# Patient Record
Sex: Female | Born: 1960 | Race: Black or African American | Hispanic: No | Smoking: Current every day smoker
Health system: Southern US, Community
[De-identification: ages and names within clinical notes are randomized; demographics above are authoritative.]

## PROBLEM LIST (undated history)

## (undated) ENCOUNTER — Emergency Department (HOSPITAL_COMMUNITY): Discharge status: Absent without leave | Payer: BLUE CROSS/BLUE SHIELD

## (undated) DIAGNOSIS — I10 Essential (primary) hypertension: Secondary | ICD-10-CM

## (undated) DIAGNOSIS — E119 Type 2 diabetes mellitus without complications: Secondary | ICD-10-CM

---

## 2013-03-22 ENCOUNTER — Emergency Department: Payer: Self-pay | Admitting: Emergency Medicine

## 2013-03-25 LAB — BETA STREP CULTURE(ARMC)

## 2014-09-10 ENCOUNTER — Emergency Department
Admission: EM | Admit: 2014-09-10 | Discharge: 2014-09-10 | Disposition: A | Discharge status: Home / Self Care | Payer: Self-pay | Attending: Emergency Medicine | Admitting: Emergency Medicine

## 2014-09-10 DIAGNOSIS — L729 Follicular cyst of the skin and subcutaneous tissue, unspecified: Secondary | ICD-10-CM | POA: Insufficient documentation

## 2014-09-10 DIAGNOSIS — L089 Local infection of the skin and subcutaneous tissue, unspecified: Secondary | ICD-10-CM

## 2014-09-10 DIAGNOSIS — E119 Type 2 diabetes mellitus without complications: Secondary | ICD-10-CM | POA: Insufficient documentation

## 2014-09-10 MED ORDER — SULFAMETHOXAZOLE-TRIMETHOPRIM 800-160 MG PO TABS
1.0000 | ORAL_TABLET | Freq: Once | ORAL | Status: AC
  Administered 2014-09-10: 1 via ORAL
  Filled 2014-09-10: qty 1

## 2014-09-10 MED ORDER — LIDOCAINE-EPINEPHRINE (PF) 1 %-1:200000 IJ SOLN
INTRAMUSCULAR | Status: AC
  Filled 2014-09-10: qty 30

## 2014-09-10 MED ORDER — SULFAMETHOXAZOLE-TRIMETHOPRIM 800-160 MG PO TABS: 1.0000 | ORAL_TABLET | Freq: Two times a day (BID) | ORAL | Status: DC

## 2014-09-10 MED ORDER — LIDOCAINE-EPINEPHRINE 1 %-1:100000 IJ SOLN: 30.0000 mL | Freq: Once | INTRAMUSCULAR | Status: DC

## 2014-09-10 NOTE — ED Notes (Signed)
Patient to ED with c/o abscess to left upper chest, patient reports initially started as a insect bite to that area last Saturday but has not gotten better.

## 2014-09-10 NOTE — Discharge Instructions (Signed)
Type 2 Diabetes Mellitus Type 2 diabetes mellitus is a long-term (chronic) disease. In type 2 diabetes:  The pancreas does not make enough of a hormone called insulin.  The cells in the body do not respond as well to the insulin that is made.  Both of the above can happen. Normally, insulin moves sugars from food into tissue cells. This gives you energy. If you have type 2 diabetes, sugars cannot be moved into tissue cells. This causes high blood sugar (hyperglycemia).  HOME CARE  Have your hemoglobin A1c level checked twice a year. The level shows if your diabetes is under control or out of control.  Test your blood sugar level every day as told by your doctor.  Check your ketone levels by testing your pee (urine) when you are sick and as told.  Take your diabetes or insulin medicine as told by your doctor.  Never run out of insulin.  Adjust how much insulin you give yourself based on how many carbs (carbohydrates) you eat. Carbs are in many foods, such as fruits, vegetables, whole grains, and dairy products.  Have a healthy snack between every healthy meal. Have 3 meals and 3 snacks a day.  Lose weight if you are overweight.  Carry a medical alert card or wear your medical alert jewelry.  Carry a 15-gram carb snack with you at all times. Examples include:  Glucose pills, 3 or 4.  Glucose gel, 15-gram tube.  Raisins, 2 tablespoons (24 grams).  Jelly beans, 6.  Animal crackers, 8.  Regular (not diet) pop, 4 ounces (120 milliliters).  Gummy treats, 9.  Notice low blood sugar (hypoglycemia) symptoms, such as:  Shaking (tremors).  Trouble thinking clearly.  Sweating.  Faster heart rate.  Headache.  Dry mouth.  Hunger.  Crabbiness (irritability).  Being worried or tense (anxious).  Restless sleep.  A change in speech or coordination.  Confusion.  Treat low blood sugar right away. If you are alert and can swallow, follow the 15:15 rule:  Take 15-20  grams of a rapid-acting glucose or carb. This includes glucose gel, glucose pills, or 4 ounces (120 milliliters) of fruit juice, regular pop, or low-fat milk.  Check your blood sugar level 15 minutes after taking the glucose.  Take 15-20 grams more of glucose if the repeat blood sugar level is still 70 mg/dL (milligrams/deciliter) or below.  Eat a meal or snack within 1 hour of the blood sugar levels going back to normal.  Notice early symptoms of high blood sugar, such as:  Being really thirsty or drinking a lot (polydipsia).  Peeing a lot (polyuria).  Do at least 150 minutes of physical activity a week or as told.  Split the 150 minutes of activity up during the week. Do not do 150 minutes of activity in one day.  Perform exercises, such as weight lifting, at least 2 times a week or as told.  Spend no more than 90 minutes at one time inactive.  Adjust your insulin or food intake as needed if you start a new exercise or sport.  Follow your sick-day plan when you are not able to eat or drink as usual.  Do not smoke, chew tobacco, or use electronic cigarettes.  Women who are not pregnant should drink no more than 1 drink a day. Men should drink no more than 2 drinks a day.  Only drink alcohol with food.  Ask your doctor if alcohol is safe for you.  Tell your doctor if you  drink alcohol several times during the week.  See your doctor regularly.  Schedule an eye exam soon after you are told you have diabetes. Schedule exams once every year.  Check your skin and feet every day. Check for cuts, bruises, redness, nail problems, bleeding, blisters, or sores. A doctor should do a foot exam once a year.  Brush your teeth and gums twice a day. Floss once a day. Visit your dentist regularly.  Share your diabetes plan with your workplace or school.  Stay up-to-date with shots that fight against diseases (immunizations).  Learn how to deal with stress.  Get diabetes education and  support as needed.  Ask your doctor for special help if:  You need help to maintain or improve how you do things on your own.  You need help to maintain or improve the quality of your life.  You have foot or hand problems.  You have trouble cleaning yourself, dressing, eating, or doing physical activity. GET HELP IF:  You are unable to eat or drink for more than 6 hours.  You feel sick to your stomach (nauseous) or throw up (vomit) for more than 6 hours.  Your blood sugar level is over 240 mg/dL.  There is a change in mental status.  You get another serious illness.  You have watery poop (diarrhea) for more than 6 hours.  You have been sick or have had a fever for 2 or more days and are not getting better.  You have pain when you are active. GET HELP RIGHT AWAY IF:  You have trouble breathing.  Your ketone levels are higher than your doctor says they should be. MAKE SURE YOU:  Understand these instructions.  Will watch your condition.  Will get help right away if you are not doing well or get worse. Document Released: 11/13/2007 Document Revised: 06/20/2013 Document Reviewed: 09/05/2011 [facility] Patient Information 2015 [place], [place]. This information is not intended to replace advice given to you by your health care provider. Make sure you discuss any questions you have with your health care provider.   Epidermal Cyst An epidermal cyst is sometimes called a sebaceous cyst, epidermal inclusion cyst, or infundibular cyst. These cysts usually contain a substance that looks "pasty" or "cheesy" and may have a bad smell. This substance is a protein called keratin. Epidermal cysts are usually found on the face, neck, or trunk. They may also occur in the vaginal area or other parts of the genitalia of both men and women. Epidermal cysts are usually small, painless, slow-growing bumps or lumps that move freely under the skin. It is important not to try to pop them. This may  cause an infection and lead to tenderness and swelling. CAUSES  Epidermal cysts may be caused by a deep penetrating injury to the skin or a plugged hair follicle, often associated with acne. SYMPTOMS  Epidermal cysts can become inflamed and cause:  Redness.  Tenderness.  Increased temperature of the skin over the bumps or lumps.  Grayish-white, bad smelling material that drains from the bump or lump. DIAGNOSIS  Epidermal cysts are easily diagnosed by your caregiver during an exam. Rarely, a tissue sample (biopsy) may be taken to rule out other conditions that may resemble epidermal cysts. TREATMENT   Epidermal cysts often get better and disappear on their own. They are rarely ever cancerous.  If a cyst becomes infected, it may become inflamed and tender. This may require opening and draining the cyst. Treatment with antibiotics may be  necessary. When the infection is gone, the cyst may be removed with minor surgery.  Small, inflamed cysts can often be treated with antibiotics or by injecting steroid medicines.  Sometimes, epidermal cysts become large and bothersome. If this happens, surgical removal in your caregiver's office may be necessary. HOME CARE INSTRUCTIONS  Only take over-the-counter or prescription medicines as directed by your caregiver.  Take your antibiotics as directed. Finish them even if you start to feel better. SEEK MEDICAL CARE IF:   Your cyst becomes tender, red, or swollen.  Your condition is not improving or is getting worse.  You have any other questions or concerns. MAKE SURE YOU:  Understand these instructions.  Will watch your condition.  Will get help right away if you are not doing well or get worse. Document Released: 01/05/2004 Document Revised: 04/28/2011 Document Reviewed: 08/12/2010 [facility] Patient Information 2015 [place], [place]. This information is not intended to replace advice given to you by your health care provider. Make sure you  discuss any questions you have with your health care provider.   Change bandage 1-2 times daily. Take antibiotics as directed. Follow-up with your primary physician for further evaluation. Monitor sugars closely for elevated sugars. Return to the emergency room for any concerns, complications.

## 2014-09-10 NOTE — ED Provider Notes (Signed)
[facility] Emergency Department Provider Note  ____________________________________________  Time seen: Approximately 3:22 PM  I have reviewed the triage vital signs and the nursing notes.   HISTORY  Chief Complaint Abscess    HPI Barbara Larsen is a 54 y.o. female with diabetes who presents with over one week of swollen, red, tender area to the left upper anterior chest. She had a previous nodule that she squeezed. Subsequently developed the redness and soreness. She denies fevers or chills. She has not checked her sugar lately. She is a type II diabetic on metformin. She also has been out of her medication.   No past medical history on file.  There are no active problems to display for this patient.   No past surgical history on file.  Current Outpatient Rx  Name  Route  Sig  Dispense  Refill  . sulfamethoxazole-trimethoprim (BACTRIM DS,SEPTRA DS) 800-160 MG per tablet   Oral   Take 1 tablet by mouth 2 (two) times daily.   14 tablet   0     Allergies Review of patient's allergies indicates not on file.  No family history on file.  Social History History  Substance Use Topics  . Smoking status: Not on file  . Smokeless tobacco: Not on file  . Alcohol Use: Not on file    Review of Systems Constitutional: No fever/chills Eyes: No visual changes. ENT: No sore throat. Cardiovascular: Denies chest pain. Respiratory: Denies shortness of breath. Gastrointestinal: No abdominal pain.  No nausea, no vomiting.  No diarrhea.  No constipation. Genitourinary: Negative for dysuria. Musculoskeletal: Negative for back pain. Neurological: Negative for headaches, focal weakness or numbness.  10-point ROS otherwise negative.  ____________________________________________   PHYSICAL EXAM:  VITAL SIGNS: ED Triage Vitals  Enc Vitals Group     BP 09/10/14 1257 147/88 mmHg     Pulse Rate 09/10/14 1257 102     Resp 09/10/14 1257 20     Temp  09/10/14 1257 98.4 F (36.9 C)     Temp Source 09/10/14 1257 Oral     SpO2 09/10/14 1257 96 %     Weight 09/10/14 1257 185 lb (83.915 kg)     Height 09/10/14 1257 5\' 6"  (1.676 m)     Head Cir --      Peak Flow --      Pain Score 09/10/14 1253 3     Pain Loc --      Pain Edu? --      Excl. in [place]? --     Constitutional: Alert and oriented. Well appearing and in no acute distress. Eyes: Conjunctivae are normal. EOMI. Head: Atraumatic. Nose: No congestion/rhinnorhea. Mouth/Throat: Mucous membranes are moist.  Oropharynx non-erythematous. Neck: supple Cardiovascular: Normal rate, regular rhythm. Grossly normal heart sounds.  Good peripheral circulation. Respiratory: Normal respiratory effort.  No retractions. Lungs CTAB. Neurologic:  Normal speech and language. No gross focal neurologic deficits are appreciated. No gait instability. Skin:  2 cm indurated area to the left upper chest with pointing. Minimal fluctuants. Erythematous. Psychiatric: Mood and affect are normal. Speech and behavior are normal.  ____________________________________________   LABS (all labs ordered are listed, but only abnormal results are displayed)  Labs Reviewed - No data to display ____________________________________________  EKG   ____________________________________________  RADIOLOGY   ____________________________________________   PROCEDURES  Procedure(s) performed: Obtained consent. Area cleaned with saline. Injected lidocaine 1% with epinephrine. Incision made with #11 blade. Moderate amount of purulent drainage. Cyst material present. Wound  packed with gauze.  Critical Care performed: No  ____________________________________________   INITIAL IMPRESSION / ASSESSMENT AND PLAN / ED COURSE  Pertinent labs & imaging results that were available during my care of the patient were reviewed by me and considered in my medical decision making (see chart for details).  54 year old with  history of diabetes presents with infected cyst to the left upper chest. Incision and drainage as above. Tolerated well. Start Septra DS twice a day. She can follow-up with her primary physician for further evaluation. Also given contact number for local surgeon for possible excision of the cyst. Return to the emergency department for any concerns. ____________________________________________   FINAL CLINICAL IMPRESSION(S) / ED DIAGNOSES  Final diagnoses:  Infected cyst of skin  Type 2 diabetes mellitus without complication      Ignacia Bayley, PA-C 09/10/14 1528  Arnaldo Natal, MD 09/10/14 [phone]

## 2015-04-05 ENCOUNTER — Other Ambulatory Visit: Payer: Self-pay | Admitting: Family Medicine

## 2015-04-05 DIAGNOSIS — Z1239 Encounter for other screening for malignant neoplasm of breast: Secondary | ICD-10-CM

## 2015-09-12 ENCOUNTER — Ambulatory Visit
Admission: RE | Admit: 2015-09-12 | Discharge: 2015-09-12 | Disposition: A | Discharge status: Home / Self Care | Payer: BLUE CROSS/BLUE SHIELD | Source: Ambulatory Visit | Attending: Family Medicine | Admitting: Family Medicine

## 2015-09-12 ENCOUNTER — Other Ambulatory Visit: Payer: Self-pay | Admitting: Family Medicine

## 2015-09-12 DIAGNOSIS — Z1231 Encounter for screening mammogram for malignant neoplasm of breast: Secondary | ICD-10-CM | POA: Diagnosis present

## 2015-09-12 DIAGNOSIS — Z1239 Encounter for other screening for malignant neoplasm of breast: Secondary | ICD-10-CM | POA: Insufficient documentation

## 2019-04-20 ENCOUNTER — Other Ambulatory Visit: Payer: Self-pay | Admitting: Family Medicine

## 2019-04-20 DIAGNOSIS — Z1231 Encounter for screening mammogram for malignant neoplasm of breast: Secondary | ICD-10-CM

## 2019-04-22 ENCOUNTER — Emergency Department
Admission: EM | Admit: 2019-04-22 | Discharge: 2019-04-22 | Disposition: A | Discharge status: Home / Self Care | Payer: BLUE CROSS/BLUE SHIELD | Attending: Emergency Medicine | Admitting: Emergency Medicine

## 2019-04-22 ENCOUNTER — Other Ambulatory Visit: Payer: Self-pay

## 2019-04-22 ENCOUNTER — Encounter: Payer: Self-pay | Admitting: Emergency Medicine

## 2019-04-22 DIAGNOSIS — R739 Hyperglycemia, unspecified: Secondary | ICD-10-CM

## 2019-04-22 DIAGNOSIS — F17200 Nicotine dependence, unspecified, uncomplicated: Secondary | ICD-10-CM | POA: Insufficient documentation

## 2019-04-22 DIAGNOSIS — I10 Essential (primary) hypertension: Secondary | ICD-10-CM | POA: Insufficient documentation

## 2019-04-22 DIAGNOSIS — E1165 Type 2 diabetes mellitus with hyperglycemia: Secondary | ICD-10-CM | POA: Diagnosis not present

## 2019-04-22 DIAGNOSIS — Z7984 Long term (current) use of oral hypoglycemic drugs: Secondary | ICD-10-CM | POA: Diagnosis not present

## 2019-04-22 HISTORY — DX: Type 2 diabetes mellitus without complications: E11.9

## 2019-04-22 HISTORY — DX: Essential (primary) hypertension: I10

## 2019-04-22 LAB — CBC
HCT: 39.7 % (ref 36.0–46.0)
Hemoglobin: 13.7 g/dL (ref 12.0–15.0)
MCH: 27.6 pg (ref 26.0–34.0)
MCHC: 34.5 g/dL (ref 30.0–36.0)
MCV: 79.9 fL — ABNORMAL LOW (ref 80.0–100.0)
Platelets: 360 10*3/uL (ref 150–400)
RBC: 4.97 MIL/uL (ref 3.87–5.11)
RDW: 14.9 % (ref 11.5–15.5)
WBC: 6.6 10*3/uL (ref 4.0–10.5)
nRBC: 0 % (ref 0.0–0.2)

## 2019-04-22 LAB — BASIC METABOLIC PANEL
Anion gap: 12 (ref 5–15)
BUN: 19 mg/dL (ref 8–23)
CO2: 25 mmol/L (ref 22–32)
Calcium: 9.6 mg/dL (ref 8.9–10.3)
Chloride: 96 mmol/L — ABNORMAL LOW (ref 98–111)
Creatinine, Ser: 1.11 mg/dL — ABNORMAL HIGH (ref 0.44–1.00)
GFR calc Af Amer: >60 mL/min (ref >60)
GFR calc non Af Amer: 54 mL/min — ABNORMAL LOW (ref >60)
Glucose, Bld: 435 mg/dL — ABNORMAL HIGH (ref 70–99)
Potassium: 4 mmol/L (ref 3.5–5.1)
Sodium: 133 mmol/L — ABNORMAL LOW (ref 135–145)

## 2019-04-22 LAB — GLUCOSE, CAPILLARY
Glucose-Capillary: 419 mg/dL — ABNORMAL HIGH (ref 70–99)
Glucose-Capillary: 219 mg/dL — ABNORMAL HIGH (ref 70–99)

## 2019-04-22 MED ORDER — SODIUM CHLORIDE 0.9 % IV BOLUS
1000.0000 mL | Freq: Once | INTRAVENOUS | Status: AC
  Administered 2019-04-22: 1000 mL via INTRAVENOUS

## 2019-04-22 MED ORDER — INSULIN ASPART 100 UNIT/ML ~~LOC~~ SOLN
10.0000 [IU] | Freq: Once | SUBCUTANEOUS | Status: AC
  Administered 2019-04-22: 10 [IU] via INTRAVENOUS
  Filled 2019-04-22: qty 1

## 2019-04-22 NOTE — ED Triage Notes (Signed)
FIRST NURSE NOTE- pt states "my doctor wants me to get a shot of insulin".  Reports her CBG was 500. Ambulatory. NAD. Alert and answering questions.

## 2019-04-22 NOTE — ED Triage Notes (Signed)
Pt states she was seen by PCP Wednesday and was called today and told her BS was elevated and she needed to "come get a shot."

## 2019-04-22 NOTE — ED Provider Notes (Signed)
[facility] Emergency Department Provider Note  ____________________________________________  Time seen: Approximately 3:54 PM  I have reviewed the triage vital signs and the nursing notes.   HISTORY  Chief Complaint Hyperglycemia    HPI Barbara Larsen is a 59 y.o. female who presents the emergency department requesting "a shot" for her diabetes.  Patient states that she had seen her primary care 2 days ago, had routine labs performed.  Patient states that she is a type II diabetic, taking Metformin.  She has been taking her medicine as prescribed but states that given some stress in her life, she has not been eating healthy.  She states that she eats a lot of carbohydrates a lot of fast food.  She also eats a lot of sugar.  Patient states that she knows that her diabetes is not well controlled, however she has been making some changes especially regarding her diet recently and is trying to improve her diet to improve her diabetes.  Patient states that she was called by her primary care to inform her that her blood sugar was over 500.  Patient has no complaints at this time, she states that her primary care referred her to the emergency department for "a shot" to make sure that her diabetes was better controlled.  Patient is unsure of her A1c or her typical blood sugar.   Again, patient denies any complaints to include fevers and chills, nasal congestion, sore throat, cough, chest pain, shortness of breath, abdominal pain, nausea vomiting, dysuria, polyuria, hematuria, polydipsia.        Past Medical History:  Diagnosis Date  . Diabetes mellitus without complication (HCC)   . Hypertension     There are no problems to display for this patient.   No past surgical history on file.  Prior to Admission medications   Medication Sig Start Date End Date Taking? Authorizing Provider  lisinopril (PRINIVIL,ZESTRIL) 5 MG tablet Take 5 mg by mouth daily.    [provider]  metFORMIN (GLUCOPHAGE) 500 MG tablet Take by mouth 1 day or 1 dose.    [provider]  simvastatin (ZOCOR) 10 MG tablet Take 10 mg by mouth daily.    [provider]  sulfamethoxazole-trimethoprim (BACTRIM DS,SEPTRA DS) 800-160 MG per tablet Take 1 tablet by mouth 2 (two) times daily. 09/10/14   Ignacia Bayley, PA-C    Allergies Codeine  Family History  Problem Relation Age of Onset  . Breast cancer Neg Hx     Social History Social History   Tobacco Use  . Smoking status: Heavy Tobacco Smoker  . Smokeless tobacco: Never Used  Substance Use Topics  . Alcohol use: Not on file  . Drug use: Not on file     Review of Systems  Constitutional: No fever/chills Eyes: No visual changes. No discharge ENT: No upper respiratory complaints. Cardiovascular: no chest pain. Respiratory: no cough. No SOB. Gastrointestinal: No abdominal pain.  No nausea, no vomiting.  No diarrhea.  No constipation. Genitourinary: Negative for dysuria. No hematuria Musculoskeletal: Negative for musculoskeletal pain. Skin: Negative for rash, abrasions, lacerations, ecchymosis. Neurological: Negative for headaches, focal weakness or numbness. 10-point ROS otherwise negative.  ____________________________________________   PHYSICAL EXAM:  VITAL SIGNS: ED Triage Vitals [04/22/19 1458]  Enc Vitals Group     BP (!) 152/78     Pulse Rate 90     Resp 18     Temp 98.5 F (36.9 C)     Temp src  SpO2 99 %     Weight 172 lb (78 kg)     Height 5\' 6"  (1.676 m)     Head Circumference      Peak Flow      Pain Score      Pain Loc      Pain Edu?      Excl. in [place]?      Constitutional: Alert and oriented. Well appearing and in no acute distress. Eyes: Conjunctivae are normal. PERRL. EOMI. Head: Atraumatic. ENT:      Ears:       Nose: No congestion/rhinnorhea.      Mouth/Throat: Mucous membranes are moist.  Neck: No stridor.   Cardiovascular: Normal rate, regular  rhythm. Normal S1 and S2.  Good peripheral circulation. Respiratory: Normal respiratory effort without tachypnea or retractions. Lungs CTAB. Good air entry to the bases with no decreased or absent breath sounds. Musculoskeletal: Full range of motion to all extremities. No gross deformities appreciated. Neurologic:  Normal speech and language. No gross focal neurologic deficits are appreciated.  Skin:  Skin is warm, dry and intact. No rash noted. Psychiatric: Mood and affect are normal. Speech and behavior are normal. Patient exhibits appropriate insight and judgement.   ____________________________________________   LABS (all labs ordered are listed, but only abnormal results are displayed)  Labs Reviewed  GLUCOSE, CAPILLARY - Abnormal; Notable for the following components:      Result Value   Glucose-Capillary 419 (*)    All other components within normal limits  BASIC METABOLIC PANEL - Abnormal; Notable for the following components:   Sodium 133 (*)    Chloride 96 (*)    Glucose, Bld 435 (*)    Creatinine, Ser 1.11 (*)    GFR calc non Af Amer 54 (*)    All other components within normal limits  CBC - Abnormal; Notable for the following components:   MCV 79.9 (*)    All other components within normal limits  GLUCOSE, CAPILLARY - Abnormal; Notable for the following components:   Glucose-Capillary 219 (*)    All other components within normal limits  URINALYSIS, COMPLETE (UACMP) WITH MICROSCOPIC  CBG MONITORING, ED  CBG MONITORING, ED  CBG MONITORING, ED   ____________________________________________  EKG   ____________________________________________  RADIOLOGY  No results found.  ____________________________________________    PROCEDURES  Procedure(s) performed:    Procedures    Medications  sodium chloride 0.9 % bolus 1,000 mL (0 mLs Intravenous Stopped 04/22/19 1751)  insulin aspart (novoLOG) injection 10 Units (10 Units Intravenous Given 04/22/19 1648)      ____________________________________________   INITIAL IMPRESSION / ASSESSMENT AND PLAN / ED COURSE  Pertinent labs & imaging results that were available during my care of the patient were reviewed by me and considered in my medical decision making (see chart for details).  Review of the [place] CSRS was performed in accordance of the [place] prior to dispensing any controlled drugs.           Patient's diagnosis is consistent with hyperglycemia.  Patient presented to emergency department for evaluation of high blood sugar.  Patient is a known diabetic, type II.  She takes her Metformin on a regular basis but states that recently she has been eating very poorly.  Patient realizes that this will affect her diabetes and states that she has made some changes recently trying to improve her diet.  Patient states that her blood sugar was over 500 at a recent primary care visit 2  days ago.  Primary care referred her to the emergency department for evaluation.  Patient's blood glucose was in the 400s, however she did not have an elevated gap.  Labs are otherwise reassuring.  At this time patient was given a liter of fluid, 10 units of insulin.  Her blood sugar improved to 219.  At this time, patient is safe for discharge.  I discussed her diabetes, diet, and long-term complications from not managing her diabetes.  She has close follow-up with her primary care and will follow up with primary care.  She states that she is making dietary changes to improve her management of her diabetes..  Patient is given ED precautions to return to the ED for any worsening or new symptoms.     ____________________________________________  FINAL CLINICAL IMPRESSION(S) / ED DIAGNOSES  Final diagnoses:  Hyperglycemia  Type 2 diabetes mellitus with hyperglycemia, without long-term current use of insulin (HCC)      NEW MEDICATIONS STARTED DURING THIS VISIT:  ED Discharge Orders    None          This chart  was dictated using voice recognition software/Dragon. Despite best efforts to proofread, errors can occur which can change the meaning. Any change was purely unintentional.    Racheal Patches, PA-C 04/22/19 1804    Sharyn Creamer, MD 04/23/19 [phone]

## 2019-05-02 ENCOUNTER — Ambulatory Visit
Admission: RE | Admit: 2019-05-02 | Discharge: 2019-05-02 | Disposition: A | Discharge status: Home / Self Care | Payer: BLUE CROSS/BLUE SHIELD | Source: Ambulatory Visit | Attending: Family Medicine | Admitting: Family Medicine

## 2019-05-02 DIAGNOSIS — Z1231 Encounter for screening mammogram for malignant neoplasm of breast: Secondary | ICD-10-CM | POA: Insufficient documentation

## 2019-05-04 ENCOUNTER — Other Ambulatory Visit: Payer: Self-pay | Admitting: Family Medicine

## 2019-05-06 ENCOUNTER — Other Ambulatory Visit: Payer: Self-pay | Admitting: Family Medicine

## 2019-05-06 DIAGNOSIS — R928 Other abnormal and inconclusive findings on diagnostic imaging of breast: Secondary | ICD-10-CM

## 2019-05-06 DIAGNOSIS — R921 Mammographic calcification found on diagnostic imaging of breast: Secondary | ICD-10-CM

## 2019-05-27 ENCOUNTER — Ambulatory Visit
Admission: RE | Admit: 2019-05-27 | Discharge: 2019-05-27 | Disposition: A | Discharge status: Home / Self Care | Payer: BLUE CROSS/BLUE SHIELD | Source: Ambulatory Visit | Attending: Family Medicine | Admitting: Family Medicine

## 2019-05-27 DIAGNOSIS — R928 Other abnormal and inconclusive findings on diagnostic imaging of breast: Secondary | ICD-10-CM | POA: Insufficient documentation

## 2019-05-27 DIAGNOSIS — R921 Mammographic calcification found on diagnostic imaging of breast: Secondary | ICD-10-CM | POA: Insufficient documentation

## 2020-04-15 IMAGING — MG DIGITAL DIAGNOSTIC UNILAT RIGHT W/ CAD
6 series · 6 of 6 positions shown · non-contrast
Comparison: Previous exam(s).

CLINICAL DATA: Calcifications lower inner right breast on a recent
screening mammogram. The patient has diabetes and is a heavy smoker.

EXAM:
DIGITAL DIAGNOSTIC RIGHT MAMMOGRAM WITH CAD

[R ML (1 of 2)]
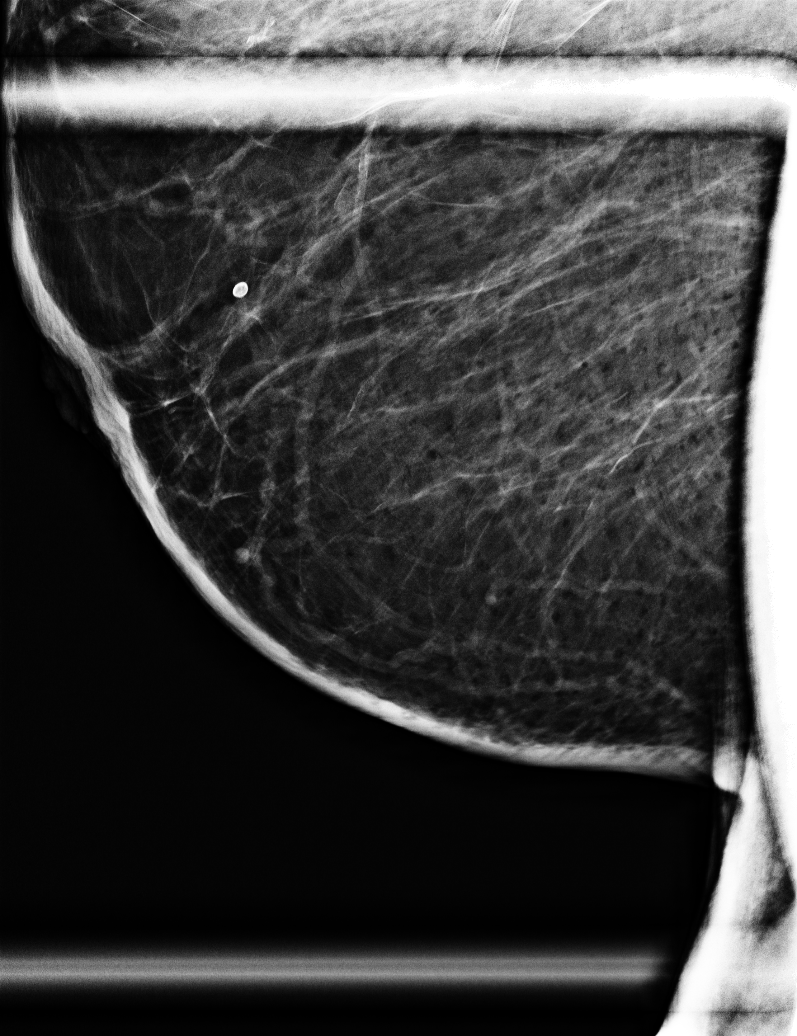

[R CC (1 of 3)]
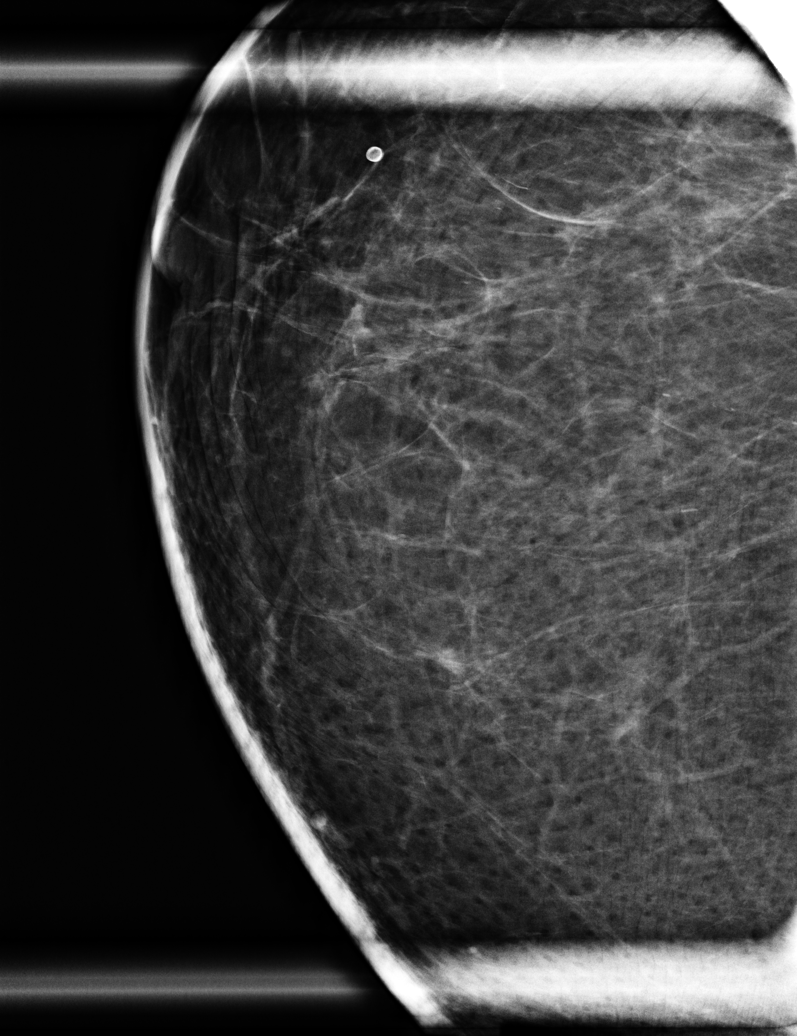

[R ML (2 of 2)]
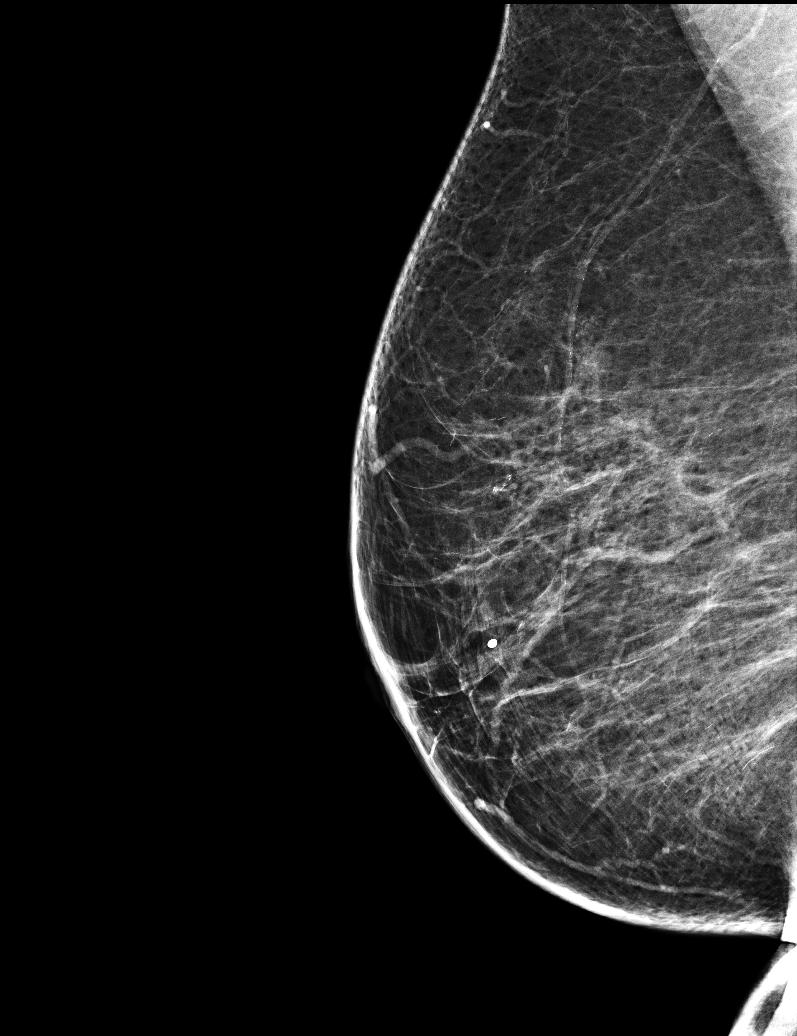

[R LM]
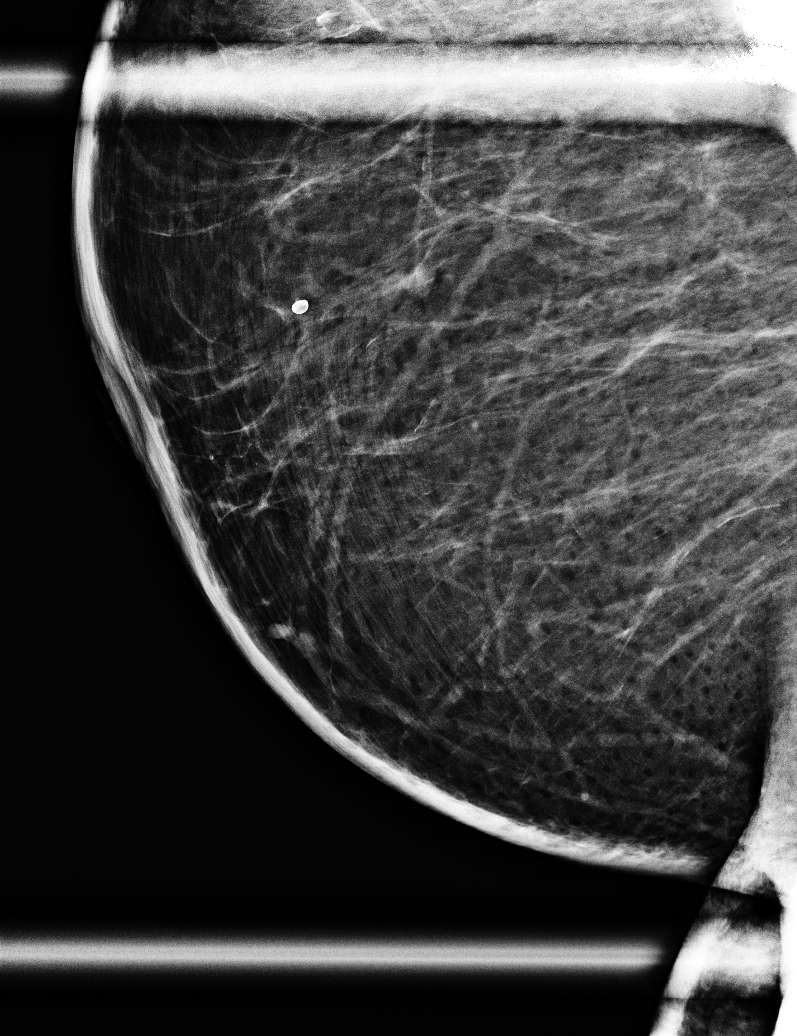

[R CC (2 of 3)]
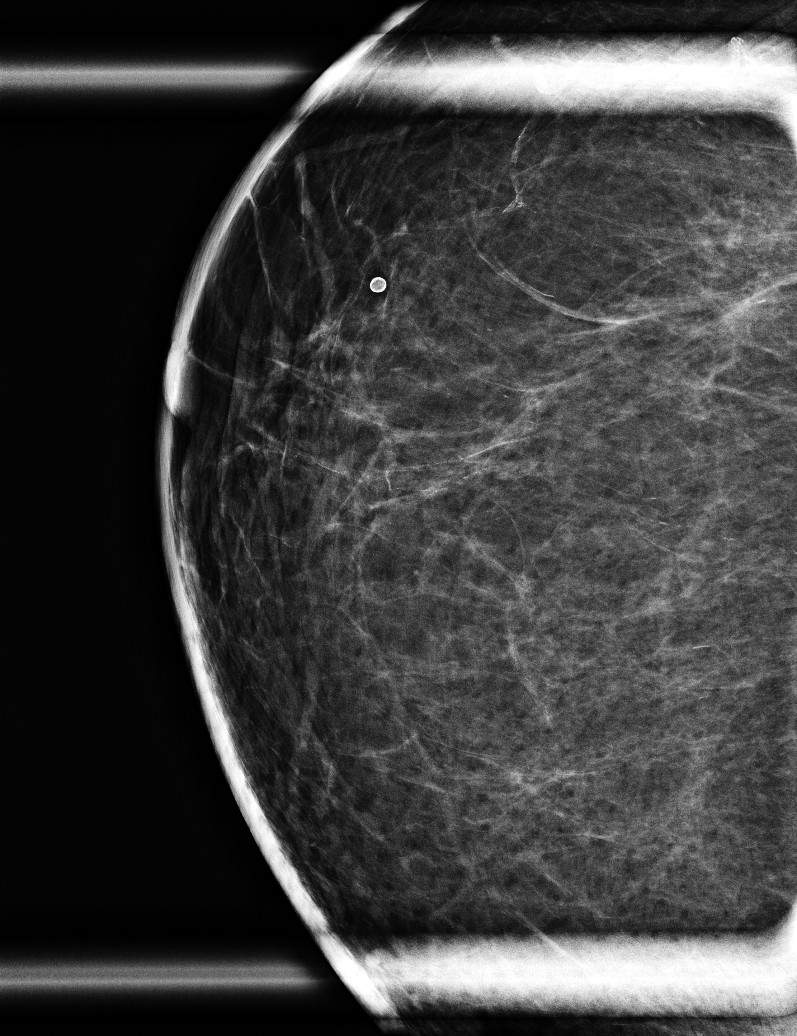

[R CC (3 of 3)]
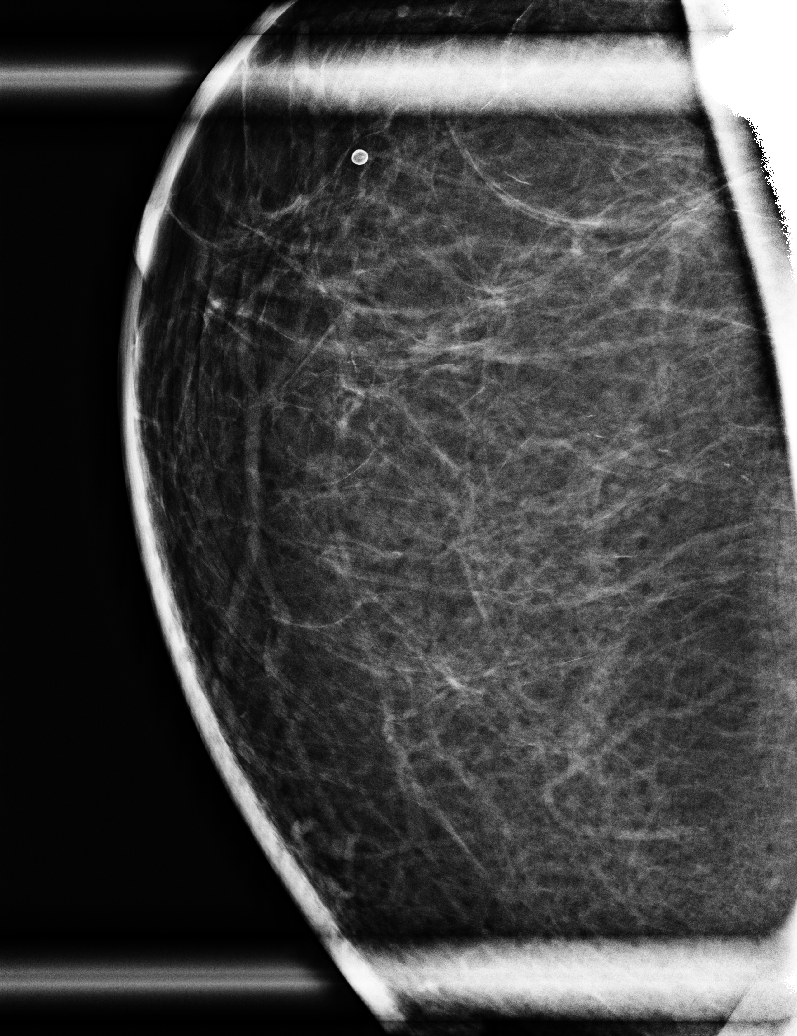

[6 of 6 positions shown; findings below may reference images not displayed]

ACR Breast Density Category b: There are scattered areas of
fibroglandular density.
FINDINGS: 2D true lateral and spot compression magnification views of the
right breast demonstrates arterial calcifications in the lower inner
quadrant, corresponding to the calcifications seen on the recent
screening mammogram. There are no findings suspicious for
malignancy.

Mammographic images were processed with CAD.
IMPRESSION: Benign right breast arterial calcifications. No evidence of
malignancy.

RECOMMENDATION:
Bilateral screening mammogram in 1 year.

I have discussed the findings and recommendations with the patient.
If applicable, a reminder letter will be sent to the patient
regarding the next appointment.

BI-RADS CATEGORY  2: Benign.

## 2020-10-24 ENCOUNTER — Other Ambulatory Visit (INDEPENDENT_AMBULATORY_CARE_PROVIDER_SITE_OTHER): Payer: Self-pay

## 2020-10-24 DIAGNOSIS — Z86010 Personal history of colonic polyps: Secondary | ICD-10-CM

## 2020-10-24 MED ORDER — PEG 3350-KCL-NA BICARB-NACL 420 G PO SOLR: 4000.0000 mL | Freq: Once | ORAL | 0 refills | Status: AC

## 2020-10-24 NOTE — Progress Notes (Signed)
Gastroenterology Pre-Procedure Review  Request Date: 11/14/2020 Requesting Physician: Dr. Allegra Lai  PATIENT REVIEW QUESTIONS: The patient responded to the following health history questions as indicated:    1. Are you having any GI issues? no 2. Do you have a personal history of Polyps? Yes but removed 3. Do you have a family history of Colon Cancer or Polyps? no 4. Diabetes Mellitus? yes (type 1) 5. Joint replacements in the past 12 months?no 6. Major health problems in the past 3 months?no 7. Any artificial heart valves, MVP, or defibrillator?no    MEDICATIONS & ALLERGIES:    Patient reports the following regarding taking any anticoagulation/antiplatelet therapy:   Plavix, Coumadin, Eliquis, Xarelto, Lovenox, Pradaxa, Brilinta, or Effient? no Aspirin? no  Patient confirms/reports the following medications:  Current Outpatient Medications  Medication Sig Dispense Refill   atorvastatin (LIPITOR) 40 MG tablet Take 40 mg by mouth at bedtime.     lisinopril (PRINIVIL,ZESTRIL) 5 MG tablet Take 5 mg by mouth daily.     metFORMIN (GLUCOPHAGE) 500 MG tablet Take by mouth 1 day or 1 dose.     simvastatin (ZOCOR) 10 MG tablet Take 10 mg by mouth daily.     No current facility-administered medications for this visit.    Patient confirms/reports the following allergies:  Allergies  Allergen Reactions   Codeine Other (See Comments)    Too strong. Patient felt off    No orders of the defined types were placed in this encounter.   AUTHORIZATION INFORMATION Primary Insurance: 1D#: Group #:  Secondary Insurance: 1D#: Group #:  SCHEDULE INFORMATION: Date: 11/14/2020 Time: Location: [facility]

## 2020-11-14 ENCOUNTER — Encounter
Admission: RE | Disposition: A | Discharge status: Home / Self Care | Payer: Self-pay | Source: Home / Self Care | Attending: Gastroenterology

## 2020-11-14 ENCOUNTER — Ambulatory Visit: Payer: 59 | Admitting: Anesthesiology

## 2020-11-14 ENCOUNTER — Ambulatory Visit
Admission: RE | Admit: 2020-11-14 | Discharge: 2020-11-14 | Disposition: A | Discharge status: Home / Self Care | Payer: 59 | Attending: Gastroenterology | Admitting: Gastroenterology

## 2020-11-14 DIAGNOSIS — K635 Polyp of colon: Secondary | ICD-10-CM | POA: Diagnosis not present

## 2020-11-14 DIAGNOSIS — Z1211 Encounter for screening for malignant neoplasm of colon: Secondary | ICD-10-CM | POA: Insufficient documentation

## 2020-11-14 DIAGNOSIS — F17200 Nicotine dependence, unspecified, uncomplicated: Secondary | ICD-10-CM | POA: Insufficient documentation

## 2020-11-14 DIAGNOSIS — Z7984 Long term (current) use of oral hypoglycemic drugs: Secondary | ICD-10-CM | POA: Insufficient documentation

## 2020-11-14 DIAGNOSIS — Z885 Allergy status to narcotic agent status: Secondary | ICD-10-CM | POA: Diagnosis not present

## 2020-11-14 DIAGNOSIS — K644 Residual hemorrhoidal skin tags: Secondary | ICD-10-CM | POA: Diagnosis not present

## 2020-11-14 DIAGNOSIS — Z86010 Personal history of colonic polyps: Secondary | ICD-10-CM

## 2020-11-14 DIAGNOSIS — Z79899 Other long term (current) drug therapy: Secondary | ICD-10-CM | POA: Insufficient documentation

## 2020-11-14 DIAGNOSIS — Z800 Family history of malignant neoplasm of digestive organs: Secondary | ICD-10-CM | POA: Insufficient documentation

## 2020-11-14 HISTORY — PX: COLONOSCOPY WITH PROPOFOL: SHX5780

## 2020-11-14 LAB — GLUCOSE, CAPILLARY: Glucose-Capillary: 262 mg/dL — ABNORMAL HIGH (ref 70–99)

## 2020-11-14 SURGERY — COLONOSCOPY WITH PROPOFOL
Anesthesia: General

## 2020-11-14 MED ORDER — PROPOFOL 500 MG/50ML IV EMUL
INTRAVENOUS | Status: AC
  Filled 2020-11-14: qty 50

## 2020-11-14 MED ORDER — PROPOFOL 10 MG/ML IV BOLUS
INTRAVENOUS | Status: DC | PRN
  Administered 2020-11-14: 90 mg via INTRAVENOUS
  Administered 2020-11-14: 10 mg via INTRAVENOUS

## 2020-11-14 MED ORDER — LIDOCAINE HCL (CARDIAC) PF 100 MG/5ML IV SOSY
PREFILLED_SYRINGE | INTRAVENOUS | Status: DC | PRN
  Administered 2020-11-14: 40 mg via INTRAVENOUS

## 2020-11-14 MED ORDER — SODIUM CHLORIDE 0.9 % IV SOLN
INTRAVENOUS | Status: DC
  Administered 2020-11-14: 20 mL/h via INTRAVENOUS

## 2020-11-14 MED ORDER — PROPOFOL 500 MG/50ML IV EMUL
INTRAVENOUS | Status: DC | PRN
  Administered 2020-11-14: 150 ug/kg/min via INTRAVENOUS

## 2020-11-14 MED ORDER — PHENYLEPHRINE HCL (PRESSORS) 10 MG/ML IV SOLN
INTRAVENOUS | Status: DC | PRN
  Administered 2020-11-14 (×2): 200 ug via INTRAVENOUS
  Administered 2020-11-14: 100 ug via INTRAVENOUS

## 2020-11-14 NOTE — Anesthesia Postprocedure Evaluation (Signed)
Anesthesia Post Note  Patient: KAREY SUTHERS  Procedure(s) Performed: COLONOSCOPY WITH PROPOFOL  Patient location during evaluation: Endoscopy Anesthesia Type: General Level of consciousness: awake and alert and oriented Pain management: pain level controlled Vital Signs Assessment: post-procedure vital signs reviewed and stable Respiratory status: spontaneous breathing, nonlabored ventilation and respiratory function stable Cardiovascular status: blood pressure returned to baseline and stable Postop Assessment: no signs of nausea or vomiting Anesthetic complications: no   No notable events documented.   Last Vitals:  Vitals:   11/14/20 0915 11/14/20 0925  BP: 128/71 132/78  Pulse: 88 88  Resp: (!) 31   Temp:    SpO2: 100% 98%    Last Pain:  Vitals:   11/14/20 0915  TempSrc:   PainSc: 0-No pain                 Yanai Hobson

## 2020-11-14 NOTE — H&P (Signed)
  Arlyss Repress, MD [address]  [address]  [place], [place] [address]  Main: [phone]  Fax: [phone] Pager: [phone]  Primary Care Physician:  Center, [facility] Primary Gastroenterologist:  Dr. Arlyss Repress  Pre-Procedure History & Physical: HPI:  Barbara Larsen is a 60 y.o. female is here for an colonoscopy.   Past Medical History:  Diagnosis Date   Diabetes mellitus without complication (HCC)    Hypertension     No past surgical history on file.  Prior to Admission medications   Medication Sig Start Date End Date Taking? Authorizing Provider  atorvastatin (LIPITOR) 40 MG tablet Take 40 mg by mouth at bedtime. 10/01/20  Yes [provider]  lisinopril (PRINIVIL,ZESTRIL) 5 MG tablet Take 5 mg by mouth daily.   Yes [provider]  metFORMIN (GLUCOPHAGE) 500 MG tablet Take by mouth 1 day or 1 dose.   Yes [provider]  simvastatin (ZOCOR) 10 MG tablet Take 10 mg by mouth daily.   Yes [provider]    Allergies as of 10/24/2020 - Review Complete 10/24/2020  Allergen Reaction Noted   Codeine Other (See Comments) 09/10/2014    Family History  Problem Relation Age of Onset   Breast cancer Neg Hx     Social History   Socioeconomic History   Marital status: Divorced    Spouse name: Not on file   Number of children: Not on file   Years of education: Not on file   Highest education level: Not on file  Occupational History   Not on file  Tobacco Use   Smoking status: Heavy Smoker   Smokeless tobacco: Never  Substance and Sexual Activity   Alcohol use: Not on file   Drug use: Not on file   Sexual activity: Not on file  Other Topics Concern   Not on file  Social History Narrative   Not on file   Social Determinants of Health   Financial Resource Strain: Not on file  Food Insecurity: Not on file  Transportation Needs: Not on file  Physical Activity: Not on file  Stress: Not on file   Social Connections: Not on file  Intimate Partner Violence: Not on file    Review of Systems: See HPI, otherwise negative ROS  Physical Exam: BP (!) 155/80   Pulse (!) 105   Temp (!) 96.8 F (36 C) (Temporal)   Resp 20   Ht 5\' 6"  (1.676 m)   Wt 77.6 kg   SpO2 100%   BMI 27.60 kg/m  General:   Alert,  pleasant and cooperative in NAD Head:  Normocephalic and atraumatic. Neck:  Supple; no masses or thyromegaly. Lungs:  Clear throughout to auscultation.    Heart:  Regular rate and rhythm. Abdomen:  Soft, nontender and nondistended. Normal bowel sounds, without guarding, and without rebound.   Neurologic:  Alert and  oriented x4;  grossly normal neurologically.  Impression/Plan: is here for an colonoscopy to be performed for colon cancer screening  Risks, benefits, limitations, and alternatives regarding  colonoscopy have been reviewed with the patient.  Questions have been answered.  All parties agreeable.   [name], MD  11/14/2020, 7:47 AM

## 2020-11-14 NOTE — Op Note (Signed)
[facility] Gastroenterology Patient Name: Barbara Larsen Procedure Date: 11/14/2020 8:26 AM MRN: [id number] Account #: [id number] Date of Birth: April 01, 1960 Admit Type: Outpatient Age: 60 Room: [facility] ENDO ROOM 4 Gender: Female Note Status: Finalized Instrument Name: Prentice Docker [id number] Procedure:             Colonoscopy Indications:           Screening in patient at increased risk: Family history                         of 1st-degree relative with colorectal cancer before                         age 29 years Providers:             Tychelle Purkey Alger Memos MD, MD Referring MD:          Clinic [facility], MD (Referring MD) Medicines:             General Anesthesia Complications:         No immediate complications. Estimated blood loss: None. Procedure:             Pre-Anesthesia Assessment:                        - Prior to the procedure, a History and Physical was                         performed, and patient medications and allergies were                         reviewed. The patient is competent. The risks and                         benefits of the procedure and the sedation options and                         risks were discussed with the patient. All questions                         were answered and informed consent was obtained.                         Patient identification and proposed procedure were                         verified by the physician, the nurse, the                         anesthesiologist, the anesthetist and the technician                         in the pre-procedure area in the procedure room in the                         endoscopy suite. Mental Status Examination: alert and                         oriented. Airway Examination: normal oropharyngeal  airway and neck mobility. Respiratory Examination:                         clear to auscultation. CV Examination: normal.                          Prophylactic Antibiotics: The patient does not require                         prophylactic antibiotics. Prior Anticoagulants: The                         patient has taken no previous anticoagulant or                         antiplatelet agents. ASA Grade Assessment: II - A                         patient with mild systemic disease. After reviewing                         the risks and benefits, the patient was deemed in                         satisfactory condition to undergo the procedure. The                         anesthesia plan was to use general anesthesia.                         Immediately prior to administration of medications,                         the patient was re-assessed for adequacy to receive                         sedatives. The heart rate, respiratory rate, oxygen                         saturations, blood pressure, adequacy of pulmonary                         ventilation, and response to care were monitored                         throughout the procedure. The physical status of the                         patient was re-assessed after the procedure.                        After obtaining informed consent, the colonoscope was                         passed under direct vision. Throughout the procedure,                         the patient's blood pressure, pulse, and oxygen  saturations were monitored continuously. The                         Colonoscope was introduced through the anus and                         advanced to the the cecum, identified by appendiceal                         orifice and ileocecal valve. The colonoscopy was                         performed without difficulty. The patient tolerated                         the procedure well. The quality of the bowel                         preparation was evaluated using the BBPS [facility] Bowel                         Preparation Scale) with scores of: Right Colon = 3,                          Transverse Colon = 3 and Left Colon = 3 (entire mucosa                         seen well with no residual staining, small fragments                         of stool or opaque liquid). The total BBPS score                         equals 9. Findings:      The perianal and digital rectal examinations were normal. Pertinent       negatives include normal sphincter tone and no palpable rectal lesions.      Two sessile polyps were found in the recto-sigmoid colon. The polyps       were 3 to 4 mm in size. These polyps were removed with a cold snare.       Resection and retrieval were complete.      The exam was otherwise without abnormality.      Non-bleeding external hemorrhoids were found during retroflexion. The       hemorrhoids were small. Impression:            - Two 3 to 4 mm polyps at the recto-sigmoid colon,                         removed with a cold snare. Resected and retrieved.                        - The examination was otherwise normal.                        - Non-bleeding external hemorrhoids. Recommendation:        - Discharge patient to home (with escort).                        -  Resume previous diet today.                        - Continue present medications.                        - Await pathology results.                        - Repeat colonoscopy in 5 years for screening purposes. Procedure Code(s):     --- Professional ---                        [phone], Colonoscopy, flexible; with removal of                         tumor(s), polyp(s), or other lesion(s) by snare                         technique Diagnosis Code(s):     --- Professional ---                        Z80.0, Family history of malignant neoplasm of                         digestive organs                        K63.5, Polyp of colon                        K64.4, Residual hemorrhoidal skin tags CPT copyright 2019 American Medical Association. All rights reserved. The codes documented in this  report are preliminary and upon coder review may  be revised to meet current compliance requirements. Dr. Libby Maw Toney Reil MD, MD 11/14/2020 9:00:59 AM This report has been signed electronically. Number of Addenda: 0 Note Initiated On: 11/14/2020 8:26 AM Scope Withdrawal Time: 0 hours 10 minutes 39 seconds  Total Procedure Duration: 0 hours 17 minutes 26 seconds  Estimated Blood Loss:  Estimated blood loss: none.      [facility]

## 2020-11-14 NOTE — Transfer of Care (Signed)
Immediate Anesthesia Transfer of Care Note  Patient: Barbara Larsen  Procedure(s) Performed: Procedure(s): COLONOSCOPY WITH PROPOFOL (N/A)  Patient Location: PACU and Endoscopy Unit  Anesthesia Type:General  Level of Consciousness: sedated  Airway & Oxygen Therapy: Patient Spontanous Breathing and Patient connected to nasal cannula oxygen  Post-op Assessment: Report given to RN and Post -op Vital signs reviewed and stable  Post vital signs: Reviewed and stable  Last Vitals:  Vitals:   11/14/20 0739  BP: (!) 155/80  Pulse: (!) 105  Resp: 20  Temp: (!) 36 C  SpO2: 100%    Complications: No apparent anesthesia complications

## 2020-11-14 NOTE — Anesthesia Preprocedure Evaluation (Signed)
Anesthesia Evaluation  Patient identified by MRN, date of birth, ID band Patient awake    Reviewed: Allergy & Precautions, NPO status , Patient's Chart, lab work & pertinent test results  History of Anesthesia Complications Negative for: history of anesthetic complications  Airway Mallampati: II  TM Distance: >3 FB Neck ROM: Full    Dental  (+) Edentulous Upper, Edentulous Lower   Pulmonary neg sleep apnea, neg COPD, Current SmokerPatient did not abstain from smoking.,    breath sounds clear to auscultation- rhonchi (-) wheezing      Cardiovascular hypertension, Pt. on medications (-) CAD, (-) Past MI, (-) Cardiac Stents and (-) CABG  Rhythm:Regular Rate:Normal - Systolic murmurs and - Diastolic murmurs    Neuro/Psych neg Seizures negative neurological ROS  negative psych ROS   GI/Hepatic negative GI ROS, Neg liver ROS,   Endo/Other  diabetes, Oral Hypoglycemic Agents  Renal/GU negative Renal ROS     Musculoskeletal negative musculoskeletal ROS (+)   Abdominal (+) - obese,   Peds  Hematology negative hematology ROS (+)   Anesthesia Other Findings Past Medical History: No date: Diabetes mellitus without complication (HCC) No date: Hypertension   Reproductive/Obstetrics                             Anesthesia Physical Anesthesia Plan  ASA: 2  Anesthesia Plan: General   Post-op Pain Management:    Induction: Intravenous  PONV Risk Score and Plan: 2 and Propofol infusion  Airway Management Planned: Natural Airway  Additional Equipment:   Intra-op Plan:   Post-operative Plan:   Informed Consent: I have reviewed the patients History and Physical, chart, labs and discussed the procedure including the risks, benefits and alternatives for the proposed anesthesia with the patient or authorized representative who has indicated his/her understanding and acceptance.     Dental  advisory given  Plan Discussed with: CRNA and Anesthesiologist  Anesthesia Plan Comments:         Anesthesia Quick Evaluation

## 2020-11-15 ENCOUNTER — Encounter: Payer: Self-pay | Admitting: Gastroenterology

## 2020-11-15 LAB — SURGICAL PATHOLOGY

## 2020-11-16 ENCOUNTER — Encounter: Payer: Self-pay | Admitting: Gastroenterology

## 2020-12-13 ENCOUNTER — Telehealth: Payer: Self-pay

## 2020-12-13 NOTE — Telephone Encounter (Signed)
Pt. Calling she had a colonoscopy 9/28 and she has not been able to pass gas and she has had constipation and abdominal pain

## 2020-12-13 NOTE — Telephone Encounter (Signed)
Called patient no answer left voicemail for a call back Dr Allegra Lai wants her to come by and get linzess [phone] samples from office and to make an appointment to come I for the chronic constipation

## 2020-12-14 ENCOUNTER — Telehealth: Payer: Self-pay

## 2020-12-14 NOTE — Telephone Encounter (Signed)
Called patient she is going to pick up samples from [place] office of linzess 145 and she is going to make an  appointment there as well

## 2021-01-30 ENCOUNTER — Ambulatory Visit: Payer: 59 | Admitting: Internal Medicine

## 2021-02-27 ENCOUNTER — Ambulatory Visit: Payer: 59 | Admitting: Internal Medicine

## 2021-04-19 ENCOUNTER — Encounter: Payer: Self-pay | Admitting: Nurse Practitioner

## 2021-04-19 ENCOUNTER — Ambulatory Visit (INDEPENDENT_AMBULATORY_CARE_PROVIDER_SITE_OTHER): Payer: 59 | Admitting: Nurse Practitioner

## 2021-04-19 ENCOUNTER — Other Ambulatory Visit: Payer: Self-pay

## 2021-04-19 VITALS — BP 130/81 | HR 92 | Ht 65.0 in | Wt 157.7 lb

## 2021-04-19 DIAGNOSIS — I10 Essential (primary) hypertension: Secondary | ICD-10-CM

## 2021-04-19 DIAGNOSIS — Z7689 Persons encountering health services in other specified circumstances: Secondary | ICD-10-CM | POA: Diagnosis not present

## 2021-04-19 DIAGNOSIS — E119 Type 2 diabetes mellitus without complications: Secondary | ICD-10-CM | POA: Insufficient documentation

## 2021-04-19 DIAGNOSIS — E1169 Type 2 diabetes mellitus with other specified complication: Secondary | ICD-10-CM

## 2021-04-19 LAB — GLUCOSE, POCT (MANUAL RESULT ENTRY): POC Glucose: 132 mg/dl — AB (ref 70–99)

## 2021-04-19 NOTE — Assessment & Plan Note (Signed)
Patient BP 130/81 in the office today. ?Advised pt to follow a low sodium and heart healthy diet. ?Continue the current medication lisinopril 5 mg  ? ?

## 2021-04-19 NOTE — Progress Notes (Signed)
? ?New Patient Office Visit ? ?Subjective:  ?Patient ID: Barbara Larsen, female    DOB: 05/27/1960  Age: 61 y.o. MRN: [id number] ? ?CC:  ?Chief Complaint  ?Patient presents with  ? New Patient (Initial Visit)  ? ? ?HPI ?Barbara Larsen presents for establishing care. Her previous PCP was at [facility] center. She has H/o hypertension and diabetes. She does not check blood sugars at home.  ? ?Past Medical History:  ?Diagnosis Date  ? Diabetes mellitus without complication (HCC)   ? Hypertension   ? ? ?Past Surgical History:  ?Procedure Laterality Date  ? COLONOSCOPY WITH PROPOFOL N/A 11/14/2020  ? Procedure: COLONOSCOPY WITH PROPOFOL;  Surgeon: Toney Reil, MD;  Location: [facility] ENDOSCOPY;  Service: Gastroenterology;  Laterality: N/A;  ? ? ?Family History  ?Problem Relation Age of Onset  ? Breast cancer Neg Hx   ? ? ?Social History  ? ?Socioeconomic History  ? Marital status: Divorced  ?  Spouse name: Not on file  ? Number of children: Not on file  ? Years of education: Not on file  ? Highest education level: Not on file  ?Occupational History  ? Not on file  ?Tobacco Use  ? Smoking status: Some Days  ?  Types: Cigarettes  ? Smokeless tobacco: Never  ?Substance and Sexual Activity  ? Alcohol use: Yes  ?  Comment: occasionaly  ? Drug use: Not on file  ? Sexual activity: Not on file  ?Other Topics Concern  ? Not on file  ?Social History Narrative  ? Not on file  ? ?Social Determinants of Health  ? ?Financial Resource Strain: Not on file  ?Food Insecurity: Not on file  ?Transportation Needs: Not on file  ?Physical Activity: Not on file  ?Stress: Not on file  ?Social Connections: Not on file  ?Intimate Partner Violence: Not on file  ? ? ?ROS ?Review of Systems  ?Constitutional:  Negative for activity change and appetite change.  ?HENT: Negative.    ?Eyes: Negative.   ?Respiratory:  Negative for cough, shortness of breath and wheezing.   ?Cardiovascular:  Negative for chest pain and palpitations.   ?Gastrointestinal: Negative.   ?Endocrine: Negative.   ?Genitourinary: Negative.   ?Musculoskeletal: Negative.   ?Skin:  Negative for color change.  ?Neurological:  Negative for dizziness, numbness and headaches.  ?Psychiatric/Behavioral:  Negative for agitation, behavioral problems and confusion.   ? ?Objective:  ? ?Today's Vitals: BP 130/81   Pulse 92   Ht 5\' 5"  (1.651 m)   Wt 157 lb 11.2 oz (71.5 kg)   BMI 26.24 kg/m?  ? ?Physical Exam ?Constitutional:   ?   Appearance: Normal appearance.  ?HENT:  ?   Head: Normocephalic.  ?   Right Ear: Tympanic membrane normal.  ?   Left Ear: Tympanic membrane normal.  ?   Nose: Nose normal.  ?   Mouth/Throat:  ?   Mouth: Mucous membranes are moist.  ?   Comments: No teeth, Awaiting for dentures. ?Eyes:  ?   Pupils: Pupils are equal, round, and reactive to light.  ?Cardiovascular:  ?   Rate and Rhythm: Normal rate and regular rhythm.  ?   Heart sounds: No murmur heard. ?Pulmonary:  ?   Effort: Pulmonary effort is normal.  ?   Breath sounds: Normal breath sounds.  ?Abdominal:  ?   General: Bowel sounds are normal.  ?   Palpations: Abdomen is soft.  ?Musculoskeletal:     ?  General: Normal range of motion.  ?Skin: ?   General: Skin is warm.  ?   Capillary Refill: Capillary refill takes less than 2 seconds.  ?Neurological:  ?   General: No focal deficit present.  ?   Mental Status: She is alert and oriented to person, place, and time. Mental status is at baseline.  ?Psychiatric:     ?   Mood and Affect: Mood normal.     ?   Behavior: Behavior normal.     ?   Thought Content: Thought content normal.     ?   Judgment: Judgment normal.  ? ? ?Assessment & Plan:  ? ?Problem List Items Addressed This Visit   ? ?  ? Cardiovascular and Mediastinum  ? Primary hypertension  ?  Patient BP 130/81 in the office today. ?Advised pt to follow a low sodium and heart healthy diet. ?Continue the current medication lisinopril 5 mg  ? ?  ?  ?  ? Endocrine  ? Diabetes mellitus (HCC)  ?  Patient  BS 132 in the office today. ?Advised patint to check the BS regularly, make a log and bring to next appointment.  ?Advised patient to monitor diet. ?Advised pt to eat variety of food including fruits, vegetables, whole grains, complex carbohydrates and proteins.  ?Advised pt to remain physical activity and follow a regular exercise routine.  ? ?  ?  ? Relevant Orders  ? POCT glucose (manual entry) (Completed)  ? Hemoglobin A1c  ? Microalbumin, urine  ?  ? Other  ? Establishing care with new doctor, encounter for - Primary  ?  Screening labs ordered. ?  ?  ? Relevant Orders  ? COMPLETE METABOLIC PANEL WITH GFR  ? CBC with Differential/Platelet  ? Lipid panel  ? ? ?Outpatient Encounter Medications as of 04/19/2021  ?Medication Sig  ? atorvastatin (LIPITOR) 40 MG tablet Take 40 mg by mouth at bedtime.  ? lisinopril (PRINIVIL,ZESTRIL) 5 MG tablet Take 5 mg by mouth daily.  ? metFORMIN (GLUCOPHAGE) 500 MG tablet Take by mouth 1 day or 1 dose.  ? simvastatin (ZOCOR) 10 MG tablet Take 10 mg by mouth daily.  ? ?No facility-administered encounter medications on file as of 04/19/2021.  ? ? ?Follow-up: Return in about 1 week (around 04/26/2021).  ? ?Kara Dies, NP ? ?

## 2021-04-19 NOTE — Assessment & Plan Note (Signed)
Patient Barbara Larsen in the office today. ?Advised patint to check the Barbara regularly, make a log and bring to next appointment.  ?Advised patient to monitor diet. ?Advised pt to eat variety of food including fruits, vegetables, whole grains, complex carbohydrates and proteins.  ?Advised pt to remain physical activity and follow a regular exercise routine.  ? ?

## 2021-04-19 NOTE — Assessment & Plan Note (Signed)
Screening labs ordered

## 2021-04-20 LAB — CBC WITH DIFFERENTIAL/PLATELET
Absolute Monocytes: 490 cells/uL (ref 200–950)
Basophils Absolute: 50 cells/uL (ref 0–200)
Basophils Relative: 0.7 %
Eosinophils Absolute: 312 cells/uL (ref 15–500)
Eosinophils Relative: 4.4 %
HCT: 39.7 % (ref 35.0–45.0)
Hemoglobin: 13 g/dL (ref 11.7–15.5)
Lymphs Abs: 2201 cells/uL (ref 850–3900)
MCH: 27.2 pg (ref 27.0–33.0)
MCHC: 32.7 g/dL (ref 32.0–36.0)
MCV: 83.1 fL (ref 80.0–100.0)
MPV: 9.2 fL (ref 7.5–12.5)
Monocytes Relative: 6.9 %
Neutro Abs: 4047 cells/uL (ref 1500–7800)
Neutrophils Relative %: 57 %
Platelets: 332 10*3/uL (ref 140–400)
RBC: 4.78 10*6/uL (ref 3.80–5.10)
RDW: 16.2 % — ABNORMAL HIGH (ref 11.0–15.0)
Total Lymphocyte: 31 %
WBC: 7.1 10*3/uL (ref 3.8–10.8)

## 2021-04-20 LAB — COMPLETE METABOLIC PANEL WITH GFR
AG Ratio: 1.8 (calc) (ref 1.0–2.5)
ALT: 13 U/L (ref 6–29)
AST: 15 U/L (ref 10–35)
Albumin: 4.4 g/dL (ref 3.6–5.1)
Alkaline phosphatase (APISO): 79 U/L (ref 37–153)
BUN: 23 mg/dL (ref 7–25)
CO2: 24 mmol/L (ref 20–32)
Calcium: 10 mg/dL (ref 8.6–10.4)
Chloride: 103 mmol/L (ref 98–110)
Creat: 0.92 mg/dL (ref 0.50–1.05)
Globulin: 2.4 g/dL (calc) (ref 1.9–3.7)
Glucose, Bld: 127 mg/dL — ABNORMAL HIGH (ref 65–99)
Potassium: 4.7 mmol/L (ref 3.5–5.3)
Sodium: 140 mmol/L (ref 135–146)
Total Bilirubin: 0.5 mg/dL (ref 0.2–1.2)
Total Protein: 6.8 g/dL (ref 6.1–8.1)
eGFR: 70 mL/min/{1.73_m2} (ref >=60)

## 2021-04-20 LAB — LIPID PANEL
Cholesterol: 102 mg/dL (ref <200)
HDL: 40 mg/dL — ABNORMAL LOW (ref >=50)
LDL Cholesterol (Calc): 43 mg/dL (calc)
Non-HDL Cholesterol (Calc): 62 mg/dL (calc) (ref <130)
Total CHOL/HDL Ratio: 2.6 (calc) (ref <5.0)
Triglycerides: 106 mg/dL (ref <150)

## 2021-04-20 LAB — MICROALBUMIN, URINE: Microalb, Ur: 0.2 mg/dL

## 2021-04-20 LAB — HEMOGLOBIN A1C
Hgb A1c MFr Bld: 9.9 % of total Hgb — ABNORMAL HIGH (ref <5.7)
Mean Plasma Glucose: 237 mg/dL
eAG (mmol/L): 13.2 mmol/L

## 2021-04-26 ENCOUNTER — Ambulatory Visit: Payer: 59 | Admitting: Nurse Practitioner

## 2021-04-26 ENCOUNTER — Encounter: Payer: Self-pay | Admitting: Nurse Practitioner

## 2021-04-26 ENCOUNTER — Ambulatory Visit (INDEPENDENT_AMBULATORY_CARE_PROVIDER_SITE_OTHER): Payer: 59 | Admitting: Nurse Practitioner

## 2021-04-26 ENCOUNTER — Other Ambulatory Visit: Payer: Self-pay

## 2021-04-26 VITALS — BP 140/87 | HR 78 | Ht 65.0 in | Wt 162.2 lb

## 2021-04-26 DIAGNOSIS — I10 Essential (primary) hypertension: Secondary | ICD-10-CM | POA: Diagnosis not present

## 2021-04-26 DIAGNOSIS — E663 Overweight: Secondary | ICD-10-CM

## 2021-04-26 DIAGNOSIS — E1169 Type 2 diabetes mellitus with other specified complication: Secondary | ICD-10-CM

## 2021-04-26 NOTE — Progress Notes (Signed)
? ?Established Patient Office Visit ? ?Subjective:  ?Patient ID: Barbara Larsen, female    DOB: 1960-11-24  Age: 61 y.o. MRN: [id number] ? ?CC:  ?Chief Complaint  ?Patient presents with  ? Lab Results  ? ? ? ?HPI ? ?Barbara Larsen presents for lab review. Her Hg A1c 9.9  and HDL 40. Everything else is WNL. ? ?HPI  ? ?Past Medical History:  ?Diagnosis Date  ? Diabetes mellitus without complication ([place])   ? Hypertension   ? ? ?Past Surgical History:  ?Procedure Laterality Date  ? COLONOSCOPY WITH PROPOFOL N/A 11/14/2020  ? Procedure: COLONOSCOPY WITH PROPOFOL;  Surgeon: Lin Landsman, MD;  Location: [facility] ENDOSCOPY;  Service: Gastroenterology;  Laterality: N/A;  ? ? ?Family History  ?Problem Relation Age of Onset  ? Breast cancer Neg Hx   ? ? ?Social History  ? ?Socioeconomic History  ? Marital status: Divorced  ?  Spouse name: Not on file  ? Number of children: Not on file  ? Years of education: Not on file  ? Highest education level: Not on file  ?Occupational History  ? Not on file  ?Tobacco Use  ? Smoking status: Some Days  ?  Types: Cigarettes  ? Smokeless tobacco: Never  ?Substance and Sexual Activity  ? Alcohol use: Yes  ?  Comment: occasionaly  ? Drug use: Not on file  ? Sexual activity: Not on file  ?Other Topics Concern  ? Not on file  ?Social History Narrative  ? Not on file  ? ?Social Determinants of Health  ? ?Financial Resource Strain: Not on file  ?Food Insecurity: Not on file  ?Transportation Needs: Not on file  ?Physical Activity: Not on file  ?Stress: Not on file  ?Social Connections: Not on file  ?Intimate Partner Violence: Not on file  ? ? ? ?Outpatient Medications Prior to Visit  ?Medication Sig Dispense Refill  ? atorvastatin (LIPITOR) 40 MG tablet Take 40 mg by mouth at bedtime.    ? lisinopril (PRINIVIL,ZESTRIL) 5 MG tablet Take 5 mg by mouth daily.    ? metFORMIN (GLUCOPHAGE) 500 MG tablet Take by mouth 1 day or 1 dose.    ? simvastatin (ZOCOR) 10 MG tablet Take 10 mg by mouth daily.     ? ?No facility-administered medications prior to visit.  ? ? ?Allergies  ?Allergen Reactions  ? Codeine Other (See Comments)  ?  Too strong. Patient felt off  ? ? ?ROS ?Review of Systems ? ?  ?Objective:  ?  ?Physical Exam ? ?BP 140/87   Pulse 78   Ht 5' 5"  (1.651 m)   Wt 162 lb 3.2 oz (73.6 kg)   BMI 26.99 kg/m?  ?Wt Readings from Last 3 Encounters:  ?04/26/21 162 lb 3.2 oz (73.6 kg)  ?04/19/21 157 lb 11.2 oz (71.5 kg)  ?11/14/20 171 lb (77.6 kg)  ? ? ? ?Health Maintenance Due  ?Topic Date Due  ? FOOT EXAM  Never done  ? OPHTHALMOLOGY EXAM  Never done  ? HIV Screening  Never done  ? Hepatitis C Screening  Never done  ? PAP SMEAR-Modifier  Never done  ? Zoster Vaccines- Shingrix (1 of 2) Never done  ? ? ?There are no preventive care reminders to display for this patient. ? ?No results found for: TSH ?Lab Results  ?Component Value Date  ? WBC 7.1 04/19/2021  ? HGB 13.0 04/19/2021  ? HCT 39.7 04/19/2021  ? MCV 83.1 04/19/2021  ? PLT 332 04/19/2021  ? ?  Lab Results  ?Component Value Date  ? NA 140 04/19/2021  ? K 4.7 04/19/2021  ? CO2 24 04/19/2021  ? GLUCOSE 127 (H) 04/19/2021  ? BUN 23 04/19/2021  ? CREATININE 0.92 04/19/2021  ? BILITOT 0.5 04/19/2021  ? AST 15 04/19/2021  ? ALT 13 04/19/2021  ? PROT 6.8 04/19/2021  ? CALCIUM 10.0 04/19/2021  ? ANIONGAP 12 04/22/2019  ? EGFR 70 04/19/2021  ? ?Lab Results  ?Component Value Date  ? CHOL 102 04/19/2021  ? ?Lab Results  ?Component Value Date  ? HDL 40 (L) 04/19/2021  ? ?Lab Results  ?Component Value Date  ? LDLCALC 43 04/19/2021  ? ?Lab Results  ?Component Value Date  ? TRIG 106 04/19/2021  ? ?Lab Results  ?Component Value Date  ? CHOLHDL 2.6 04/19/2021  ? ?Lab Results  ?Component Value Date  ? HGBA1C 9.9 (H) 04/19/2021  ? ? ?  ?Assessment & Plan:  ? ?Problem List Items Addressed This Visit   ? ?  ? Cardiovascular and Mediastinum  ? Primary hypertension  ?  BP 140/87 ?Continue the current medication. ?HDL 40 on the lower side. Advise pt to eat handful of almonds and  walnuts.  ? ?  ?  ?  ? Endocrine  ? Diabetes mellitus ([place]) - Primary  ?  Recent Hb A1c 9.9 ?Advised pt to watch diet and follow a regular exercise routine. ?Gave patient sample of Januvia 100 mg. ?Advised pt to take half tablet daily (50 mg). ?Will continue to monitor. ?  ?  ?  ? Other  ? Overweight (BMI 25.0-29.9)  ? ? ? ?No orders of the defined types were placed in this encounter. ? ? ? ?Follow-up: Return in about 1 month (around 05/27/2021).  ? ? ?Theresia Lo, NP ?

## 2021-04-28 DIAGNOSIS — E663 Overweight: Secondary | ICD-10-CM | POA: Insufficient documentation

## 2021-04-28 NOTE — Assessment & Plan Note (Signed)
Recent Hb A1c 9.9 ?Advised pt to watch diet and follow a regular exercise routine. ?Gave patient sample of Januvia 100 mg. ?Advised pt to take half tablet daily (50 mg). ?Will continue to monitor. ?

## 2021-04-28 NOTE — Assessment & Plan Note (Signed)
BMI 26.99. ?Advise pt to lose weight. ?

## 2021-04-28 NOTE — Assessment & Plan Note (Signed)
BP 140/87 ?Continue the current medication. ?HDL 40 on the lower side. Advise pt to eat handful of almonds and walnuts.  ? ?

## 2021-05-17 DIAGNOSIS — H40053 Ocular hypertension, bilateral: Secondary | ICD-10-CM | POA: Diagnosis not present

## 2021-05-17 LAB — HM DIABETES EYE EXAM

## 2021-07-03 ENCOUNTER — Encounter: Payer: Self-pay | Admitting: *Deleted

## 2021-07-26 ENCOUNTER — Other Ambulatory Visit: Payer: Self-pay

## 2021-07-26 MED ORDER — METFORMIN HCL 500 MG PO TABS: 1000.0000 mg | ORAL_TABLET | ORAL | 1 refills | Status: DC

## 2021-08-30 ENCOUNTER — Other Ambulatory Visit: Payer: Self-pay | Admitting: *Deleted

## 2021-08-30 MED ORDER — LISINOPRIL 5 MG PO TABS: 5.0000 mg | ORAL_TABLET | Freq: Every day | ORAL | 1 refills | Status: DC

## 2021-08-30 MED ORDER — SIMVASTATIN 10 MG PO TABS: 10.0000 mg | ORAL_TABLET | Freq: Every day | ORAL | 3 refills | Status: DC

## 2021-08-30 MED ORDER — METFORMIN HCL 500 MG PO TABS: 1000.0000 mg | ORAL_TABLET | ORAL | 1 refills | Status: DC

## 2021-09-19 NOTE — Progress Notes (Deleted)
Established Patient Office Visit  Subjective:  Patient ID: Barbara Larsen, female    DOB: [birth year]  Age: 61 y.o. MRN: [id number]  CC:  No chief complaint on file.    HPI  Barbara Larsen presents for routine follow up.  Patient has history of hypertension, dyslipidemia and diabetes.  She is taking lisinopril 5 mg, metformin 500 mg twice a day and simvastatin 20 mg.  HPI   Past Medical History:  Diagnosis Date   Diabetes mellitus without complication ([place])    Hypertension     Past Surgical History:  Procedure Laterality Date   COLONOSCOPY WITH PROPOFOL N/A 11/14/2020   Procedure: COLONOSCOPY WITH PROPOFOL;  Surgeon: Lin Landsman, MD;  Location: [facility] ENDOSCOPY;  Service: Gastroenterology;  Laterality: N/A;    Family History  Problem Relation Age of Onset   Breast cancer Neg Hx     Social History   Socioeconomic History   Marital status: Divorced    Spouse name: Not on file   Number of children: Not on file   Years of education: Not on file   Highest education level: Not on file  Occupational History   Not on file  Tobacco Use   Smoking status: Some Days    Types: Cigarettes   Smokeless tobacco: Never  Substance and Sexual Activity   Alcohol use: Yes    Comment: occasionaly   Drug use: Not on file   Sexual activity: Not on file  Other Topics Concern   Not on file  Social History Narrative   Not on file   Social Determinants of Health   Financial Resource Strain: Not on file  Food Insecurity: Not on file  Transportation Needs: Not on file  Physical Activity: Not on file  Stress: Not on file  Social Connections: Not on file  Intimate Partner Violence: Not on file     Outpatient Medications Prior to Visit  Medication Sig Dispense Refill   lisinopril (ZESTRIL) 5 MG tablet Take 1 tablet (5 mg total) by mouth daily. 90 tablet 1   metFORMIN (GLUCOPHAGE) 500 MG tablet Take 2 tablets (1,000 mg total) by mouth 1 day or 1 dose. 180 tablet 1    simvastatin (ZOCOR) 10 MG tablet Take 1 tablet (10 mg total) by mouth daily. 90 tablet 3   No facility-administered medications prior to visit.    Allergies  Allergen Reactions   Codeine Other (See Comments)    Too strong. Patient felt off    ROS Review of Systems  Constitutional:  Negative for activity change, chills and fatigue.  HENT:  Negative for congestion, ear discharge and sinus pressure.   Eyes:  Negative for discharge and redness.  Respiratory:  Negative for apnea, shortness of breath and wheezing.   Cardiovascular:  Negative for chest pain and palpitations.  Gastrointestinal:  Negative for abdominal distention, blood in stool and rectal pain.  Genitourinary:  Negative for difficulty urinating, frequency and urgency.  Musculoskeletal:  Negative for arthralgias, gait problem and neck pain.  Skin:  Negative for color change and rash.  Neurological:  Negative for dizziness, facial asymmetry and headaches.  Psychiatric/Behavioral:  Negative for agitation, behavioral problems and confusion.       Objective:    Physical Exam Constitutional:      Appearance: She is overweight.  HENT:     Head: Normocephalic.     Right Ear: Tympanic membrane normal.     Left Ear: Tympanic membrane normal.     Nose:  Nose normal.     Mouth/Throat:     Mouth: Mucous membranes are moist.     Pharynx: Oropharynx is clear.  Eyes:     Extraocular Movements: Extraocular movements intact.     Conjunctiva/sclera: Conjunctivae normal.     Pupils: Pupils are equal, round, and reactive to light.  Cardiovascular:     Rate and Rhythm: Normal rate and regular rhythm.     Pulses: Normal pulses.     Heart sounds: Normal heart sounds.  Pulmonary:     Effort: Pulmonary effort is normal.     Breath sounds: Normal breath sounds.  Abdominal:     General: Abdomen is flat. Bowel sounds are normal.     Palpations: Abdomen is soft.     Tenderness: There is no abdominal tenderness.     Hernia: No hernia  is present.  Musculoskeletal:        General: No signs of injury. Normal range of motion.     Cervical back: Neck supple. No tenderness.  Skin:    General: Skin is warm.     Findings: No erythema.  Neurological:     General: No focal deficit present.     Mental Status: She is alert and oriented to person, place, and time. Mental status is at baseline.  Psychiatric:        Mood and Affect: Mood normal.        Behavior: Behavior normal. Behavior is cooperative.        Thought Content: Thought content normal.        Judgment: Judgment normal.     There were no vitals taken for this visit. Wt Readings from Last 3 Encounters:  04/26/21 162 lb 3.2 oz (73.6 kg)  04/19/21 157 lb 11.2 oz (71.5 kg)  11/14/20 171 lb (77.6 kg)     Health Maintenance Due  Topic Date Due   COVID-19 Vaccine (1) Never done   FOOT EXAM  Never done   HIV Screening  Never done   Hepatitis C Screening  Never done   PAP SMEAR-Modifier  Never done   Zoster Vaccines- Shingrix (1 of 2) Never done   MAMMOGRAM  05/26/2021   INFLUENZA VACCINE  09/17/2021    There are no preventive care reminders to display for this patient.  No results found for: "TSH" Lab Results  Component Value Date   WBC 7.1 04/19/2021   HGB 13.0 04/19/2021   HCT 39.7 04/19/2021   MCV 83.1 04/19/2021   PLT 332 04/19/2021   Lab Results  Component Value Date   NA 140 04/19/2021   K 4.7 04/19/2021   CO2 24 04/19/2021   GLUCOSE 127 (H) 04/19/2021   BUN 23 04/19/2021   CREATININE 0.92 04/19/2021   BILITOT 0.5 04/19/2021   AST 15 04/19/2021   ALT 13 04/19/2021   PROT 6.8 04/19/2021   CALCIUM 10.0 04/19/2021   ANIONGAP 12 04/22/2019   EGFR 70 04/19/2021   Lab Results  Component Value Date   CHOL 102 04/19/2021   Lab Results  Component Value Date   HDL 40 (L) 04/19/2021   Lab Results  Component Value Date   LDLCALC 43 04/19/2021   Lab Results  Component Value Date   TRIG 106 04/19/2021   Lab Results  Component Value  Date   CHOLHDL 2.6 04/19/2021   Lab Results  Component Value Date   HGBA1C 9.9 (H) 04/19/2021      Assessment & Plan:   Problem List Items Addressed  This Visit   None   No orders of the defined types were placed in this encounter.    Follow-up: No follow-ups on file.    Theresia Lo, NP

## 2021-09-20 ENCOUNTER — Ambulatory Visit: Payer: 59 | Admitting: Nurse Practitioner

## 2021-09-20 DIAGNOSIS — E663 Overweight: Secondary | ICD-10-CM

## 2021-09-20 DIAGNOSIS — I10 Essential (primary) hypertension: Secondary | ICD-10-CM

## 2021-09-20 DIAGNOSIS — E1169 Type 2 diabetes mellitus with other specified complication: Secondary | ICD-10-CM

## 2021-10-31 ENCOUNTER — Encounter: Payer: Self-pay | Admitting: Nurse Practitioner

## 2021-10-31 ENCOUNTER — Ambulatory Visit (INDEPENDENT_AMBULATORY_CARE_PROVIDER_SITE_OTHER): Payer: 59 | Admitting: Nurse Practitioner

## 2021-10-31 VITALS — BP 130/86 | HR 84 | Ht 65.0 in | Wt 159.3 lb

## 2021-10-31 DIAGNOSIS — E119 Type 2 diabetes mellitus without complications: Secondary | ICD-10-CM

## 2021-10-31 DIAGNOSIS — Z1231 Encounter for screening mammogram for malignant neoplasm of breast: Secondary | ICD-10-CM

## 2021-10-31 DIAGNOSIS — E1169 Type 2 diabetes mellitus with other specified complication: Secondary | ICD-10-CM | POA: Diagnosis not present

## 2021-10-31 DIAGNOSIS — E782 Mixed hyperlipidemia: Secondary | ICD-10-CM | POA: Diagnosis not present

## 2021-10-31 DIAGNOSIS — E663 Overweight: Secondary | ICD-10-CM | POA: Diagnosis not present

## 2021-10-31 DIAGNOSIS — I10 Essential (primary) hypertension: Secondary | ICD-10-CM | POA: Diagnosis not present

## 2021-10-31 LAB — POCT GLYCOSYLATED HEMOGLOBIN (HGB A1C): HbA1c POC (<> result, manual entry): 13 % (ref 4.0–5.6)

## 2021-10-31 LAB — GLUCOSE, POCT (MANUAL RESULT ENTRY): POC Glucose: 373 mg/dl — AB (ref 70–99)

## 2021-10-31 MED ORDER — SITAGLIPTIN PHOSPHATE 50 MG PO TABS: 50.0000 mg | ORAL_TABLET | Freq: Every day | ORAL | 2 refills | Status: DC

## 2021-10-31 NOTE — Assessment & Plan Note (Signed)
Patient blood pressure 130/86 in the office today. Continue lisinopril 5 mg daily.

## 2021-10-31 NOTE — Assessment & Plan Note (Addendum)
Blood sugar not under control.  Hemoglobin A1c 13 and blood sugar 373 in the office today. Advised pt to check the BS regularly, make a log and bring to next appointment.  Advised pt to monitor diet. Advised pt to eat variety of food including fruits, vegetables, whole grains, complex carbohydrates and proteins.  Started her on Januvia and continue metformin. Provided handout for  diabetes nutrition and exercise. Would refer her to endocrinologist.

## 2021-10-31 NOTE — Assessment & Plan Note (Signed)
Continue statin therapy for cardiovascular risk reduction. 

## 2021-10-31 NOTE — Progress Notes (Signed)
Established Patient Office Visit  Subjective:  Patient ID: Barbara Larsen, female    DOB: [birth year]  Age: 61 y.o. MRN: [id number]  CC:  Chief Complaint  Patient presents with   Diabetes     HPI  [name] presents for her routine follow-up.  She has history of hypertension and diabetes. Her last globin A1c  was 9.9.  HPI   Past Medical History:  Diagnosis Date   Diabetes mellitus without complication ([place])    Hypertension     Past Surgical History:  Procedure Laterality Date   COLONOSCOPY WITH PROPOFOL N/A 11/14/2020   Procedure: COLONOSCOPY WITH PROPOFOL;  Surgeon: Lin Landsman, MD;  Location: [facility] ENDOSCOPY;  Service: Gastroenterology;  Laterality: N/A;    Family History  Problem Relation Age of Onset   Breast cancer Neg Hx     Social History   Socioeconomic History   Marital status: Divorced    Spouse name: Not on file   Number of children: Not on file   Years of education: Not on file   Highest education level: Not on file  Occupational History   Not on file  Tobacco Use   Smoking status: Some Days    Types: Cigarettes   Smokeless tobacco: Never  Substance and Sexual Activity   Alcohol use: Yes    Comment: occasionaly   Drug use: Not on file   Sexual activity: Not on file  Other Topics Concern   Not on file  Social History Narrative   Not on file   Social Determinants of Health   Financial Resource Strain: Not on file  Food Insecurity: Not on file  Transportation Needs: Not on file  Physical Activity: Not on file  Stress: Not on file  Social Connections: Not on file  Intimate Partner Violence: Not on file     Outpatient Medications Prior to Visit  Medication Sig Dispense Refill   atorvastatin (LIPITOR) 40 MG tablet Take 40 mg by mouth daily.     lisinopril (ZESTRIL) 5 MG tablet Take 1 tablet (5 mg total) by mouth daily. 90 tablet 1   metFORMIN (GLUCOPHAGE) 500 MG tablet Take 2 tablets (1,000 mg total) by mouth 1 day or 1  dose. 180 tablet 1   simvastatin (ZOCOR) 10 MG tablet Take 1 tablet (10 mg total) by mouth daily. 90 tablet 3   No facility-administered medications prior to visit.    Allergies  Allergen Reactions   Codeine Other (See Comments)    Too strong. Patient felt off    ROS Review of Systems  Constitutional: Negative.   HENT: Negative.    Eyes: Negative.   Respiratory:  Negative for chest tightness and shortness of breath.   Cardiovascular:  Negative for chest pain and palpitations.  Gastrointestinal: Negative.   Genitourinary: Negative.   Musculoskeletal: Negative.   Neurological: Negative.   Psychiatric/Behavioral: Negative.        Objective:    Physical Exam Constitutional:      Appearance: Normal appearance. She is obese.  HENT:     Head: Normocephalic.     Right Ear: Tympanic membrane normal.     Left Ear: Tympanic membrane normal.     Nose: Nose normal.     Mouth/Throat:     Mouth: Mucous membranes are moist.     Pharynx: Oropharynx is clear.  Eyes:     Extraocular Movements: Extraocular movements intact.     Conjunctiva/sclera: Conjunctivae normal.     Pupils: Pupils are  equal, round, and reactive to light.  Cardiovascular:     Rate and Rhythm: Normal rate and regular rhythm.     Pulses: Normal pulses.     Heart sounds: Normal heart sounds.  Pulmonary:     Effort: Pulmonary effort is normal. No respiratory distress.     Breath sounds: Normal breath sounds. No rhonchi.  Abdominal:     General: Bowel sounds are normal.     Palpations: Abdomen is soft. There is no mass.     Tenderness: There is no abdominal tenderness.     Hernia: No hernia is present.  Musculoskeletal:        General: Normal range of motion.     Cervical back: Neck supple. No tenderness.  Skin:    General: Skin is warm.     Capillary Refill: Capillary refill takes less than 2 seconds.  Neurological:     General: No focal deficit present.     Mental Status: She is alert and oriented to  person, place, and time. Mental status is at baseline.  Psychiatric:        Mood and Affect: Mood normal.        Behavior: Behavior normal.        Thought Content: Thought content normal.        Judgment: Judgment normal.     BP 130/86   Pulse 84   Ht 5' 5"  (1.651 m)   Wt 159 lb 4.8 oz (72.3 kg)   BMI 26.51 kg/m  Wt Readings from Last 3 Encounters:  10/31/21 159 lb 4.8 oz (72.3 kg)  04/26/21 162 lb 3.2 oz (73.6 kg)  04/19/21 157 lb 11.2 oz (71.5 kg)     Health Maintenance Due  Topic Date Due   FOOT EXAM  Never done   HIV Screening  Never done   Diabetic kidney evaluation - Urine ACR  Never done   Hepatitis C Screening  Never done   MAMMOGRAM  05/26/2021    There are no preventive care reminders to display for this patient.  No results found for: "TSH" Lab Results  Component Value Date   WBC 7.1 04/19/2021   HGB 13.0 04/19/2021   HCT 39.7 04/19/2021   MCV 83.1 04/19/2021   PLT 332 04/19/2021   Lab Results  Component Value Date   NA 140 04/19/2021   K 4.7 04/19/2021   CO2 24 04/19/2021   GLUCOSE 127 (H) 04/19/2021   BUN 23 04/19/2021   CREATININE 0.92 04/19/2021   BILITOT 0.5 04/19/2021   AST 15 04/19/2021   ALT 13 04/19/2021   PROT 6.8 04/19/2021   CALCIUM 10.0 04/19/2021   ANIONGAP 12 04/22/2019   EGFR 70 04/19/2021   Lab Results  Component Value Date   CHOL 102 04/19/2021   Lab Results  Component Value Date   HDL 40 (L) 04/19/2021   Lab Results  Component Value Date   LDLCALC 43 04/19/2021   Lab Results  Component Value Date   TRIG 106 04/19/2021   Lab Results  Component Value Date   CHOLHDL 2.6 04/19/2021   Lab Results  Component Value Date   HGBA1C 13.0 10/31/2021      Assessment & Plan:   Problem List Items Addressed This Visit       Cardiovascular and Mediastinum   Primary hypertension    Patient blood pressure 130/86 in the office today. Continue lisinopril 5 mg daily.       Relevant Medications   atorvastatin  (  LIPITOR) 40 MG tablet     Endocrine   Diabetes mellitus ([place]) - Primary    Blood sugar not under control.  Hemoglobin A1c 13 and blood sugar 373 in the office today. Advised pt to check the BS regularly, make a log and bring to next appointment.  Advised pt to monitor diet. Advised pt to eat variety of food including fruits, vegetables, whole grains, complex carbohydrates and proteins.  Started her on Januvia and continue metformin. Provided handout for  diabetes nutrition and exercise. Would refer her to endocrinologist.      Relevant Medications   atorvastatin (LIPITOR) 40 MG tablet   sitaGLIPtin (JANUVIA) 50 MG tablet   Other Relevant Orders   POCT glucose (manual entry) (Completed)   POCT HgB A1C (Completed)   Ambulatory referral to Podiatry     Other   Overweight (BMI 25.0-29.9)   Mixed hyperlipidemia    Continue statin therapy for cardiovascular risk reduction.      Relevant Medications   atorvastatin (LIPITOR) 40 MG tablet   Other Visit Diagnoses     Encounter for screening mammogram for malignant neoplasm of breast       Relevant Orders   MM 3D SCREEN BREAST BILATERAL   Encounter for diabetic foot exam ([place])       Relevant Medications   atorvastatin (LIPITOR) 40 MG tablet   sitaGLIPtin (JANUVIA) 50 MG tablet   Other Relevant Orders   Ambulatory referral to Podiatry        Meds ordered this encounter  Medications   sitaGLIPtin (JANUVIA) 50 MG tablet    Sig: Take 1 tablet (50 mg total) by mouth daily.    Dispense:  60 tablet    Refill:  2     Follow-up: Return in about 3 weeks (around 11/21/2021).    Theresia Lo, NP

## 2021-11-01 NOTE — Addendum Note (Signed)
Addended by: Jobie Quaker on: 11/01/2021 11:37 AM   Modules accepted: Orders

## 2021-11-29 ENCOUNTER — Ambulatory Visit: Payer: 59 | Admitting: Podiatry

## 2021-12-09 ENCOUNTER — Ambulatory Visit: Payer: 59 | Admitting: Nurse Practitioner

## 2021-12-26 ENCOUNTER — Telehealth: Payer: Self-pay | Admitting: *Deleted

## 2021-12-26 NOTE — Telephone Encounter (Signed)
I canceled the no show status for 11/29/2021.
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
## 2021-12-27 ENCOUNTER — Ambulatory Visit: Payer: 59 | Admitting: Podiatry

## 2022-04-09 ENCOUNTER — Other Ambulatory Visit: Payer: Self-pay | Admitting: Nurse Practitioner

## 2022-04-30 ENCOUNTER — Ambulatory Visit
Admission: RE | Admit: 2022-04-30 | Discharge: 2022-04-30 | Disposition: A | Discharge status: Home / Self Care | Payer: 59 | Source: Ambulatory Visit | Attending: Nurse Practitioner | Admitting: Nurse Practitioner

## 2022-04-30 DIAGNOSIS — Z1231 Encounter for screening mammogram for malignant neoplasm of breast: Secondary | ICD-10-CM

## 2022-05-19 ENCOUNTER — Other Ambulatory Visit: Payer: Self-pay | Admitting: Nurse Practitioner

## 2022-05-29 ENCOUNTER — Encounter: Payer: Self-pay | Admitting: Nurse Practitioner

## 2022-05-29 ENCOUNTER — Other Ambulatory Visit: Payer: 59

## 2022-05-29 ENCOUNTER — Ambulatory Visit: Payer: 59 | Admitting: Nurse Practitioner

## 2022-05-29 VITALS — BP 144/100 | HR 90 | Temp 97.5°F | Ht 65.0 in | Wt 161.8 lb

## 2022-05-29 DIAGNOSIS — E782 Mixed hyperlipidemia: Secondary | ICD-10-CM

## 2022-05-29 DIAGNOSIS — E1169 Type 2 diabetes mellitus with other specified complication: Secondary | ICD-10-CM | POA: Diagnosis not present

## 2022-05-29 DIAGNOSIS — I10 Essential (primary) hypertension: Secondary | ICD-10-CM

## 2022-05-29 LAB — CBC WITH DIFFERENTIAL/PLATELET
Basophils Absolute: 0 10*3/uL (ref 0.0–0.1)
Basophils Relative: 0.8 % (ref 0.0–3.0)
Eosinophils Absolute: 0.3 10*3/uL (ref 0.0–0.7)
Eosinophils Relative: 4.7 % (ref 0.0–5.0)
HCT: 41.9 % (ref 36.0–46.0)
Hemoglobin: 14.4 g/dL (ref 12.0–15.0)
Lymphocytes Relative: 33.8 % (ref 12.0–46.0)
Lymphs Abs: 1.9 10*3/uL (ref 0.7–4.0)
MCHC: 34.3 g/dL (ref 30.0–36.0)
MCV: 79.8 fl (ref 78.0–100.0)
Monocytes Absolute: 0.3 10*3/uL (ref 0.1–1.0)
Monocytes Relative: 4.9 % (ref 3.0–12.0)
Neutro Abs: 3.1 10*3/uL (ref 1.4–7.7)
Neutrophils Relative %: 55.8 % (ref 43.0–77.0)
Platelets: 330 10*3/uL (ref 150.0–400.0)
RBC: 5.25 Mil/uL — ABNORMAL HIGH (ref 3.87–5.11)
RDW: 15.1 % (ref 11.5–15.5)
WBC: 5.6 10*3/uL (ref 4.0–10.5)

## 2022-05-29 LAB — MICROALBUMIN / CREATININE URINE RATIO
Creatinine,U: 44.4 mg/dL
Microalb Creat Ratio: 3.3 mg/g (ref 0.0–30.0)
Microalb, Ur: 1.5 mg/dL (ref 0.0–1.9)

## 2022-05-29 LAB — COMPREHENSIVE METABOLIC PANEL
ALT: 13 U/L (ref 0–35)
AST: 13 U/L (ref 0–37)
Albumin: 4.5 g/dL (ref 3.5–5.2)
Alkaline Phosphatase: 114 U/L (ref 39–117)
BUN: 14 mg/dL (ref 6–23)
CO2: 28 mEq/L (ref 19–32)
Calcium: 9.9 mg/dL (ref 8.4–10.5)
Chloride: 99 mEq/L (ref 96–112)
Creatinine, Ser: 1.01 mg/dL (ref 0.40–1.20)
GFR: 59 mL/min — ABNORMAL LOW (ref >60.00)
Glucose, Bld: 311 mg/dL — ABNORMAL HIGH (ref 70–99)
Potassium: 3.8 mEq/L (ref 3.5–5.1)
Sodium: 135 mEq/L (ref 135–145)
Total Bilirubin: 0.5 mg/dL (ref 0.2–1.2)
Total Protein: 7.3 g/dL (ref 6.0–8.3)

## 2022-05-29 LAB — LIPID PANEL
Cholesterol: 110 mg/dL (ref 0–200)
HDL: 35.6 mg/dL — ABNORMAL LOW (ref >39.00)
LDL Cholesterol: 50 mg/dL (ref 0–99)
NonHDL: 73.92
Total CHOL/HDL Ratio: 3
Triglycerides: 118 mg/dL (ref 0.0–149.0)
VLDL: 23.6 mg/dL (ref 0.0–40.0)

## 2022-05-29 LAB — LDL CHOLESTEROL, DIRECT: Direct LDL: 57 mg/dL

## 2022-05-29 LAB — TSH: TSH: 0.67 u[IU]/mL (ref 0.35–5.50)

## 2022-05-29 NOTE — Patient Instructions (Addendum)
Labs ordered. Will call with the lab result. Check BP at home and bring the number to next appointment in 4 weeks.

## 2022-05-29 NOTE — Assessment & Plan Note (Signed)
Patient BP  Vitals:   05/29/22 1255 05/29/22 1329  BP: (!) 150/104 (!) 144/100    in the office 05/29/22  Advised pt to follow a low sodium and heart healthy diet. Continue lisinopril and will change medication if needed after the lab result.

## 2022-05-29 NOTE — Progress Notes (Signed)
Established Patient Office Visit  Subjective:  Patient ID: Barbara Larsen, female    DOB: Nov 10, 1960  Age: 62 y.o. MRN: [id number]  CC:  Chief Complaint  Patient presents with   Medical Management of Chronic Issues    HPI  Barbara Larsen presents for chronic disease management. She has h/o hypertension, diabetes, hyperlipidemia and smoker. She states she smokes some days.   Diabetes: Her last POCT Hg A1c 13. She is only taking metformin 1000 mg twice a day. She is checking her BS at home sometimes at the reading is between 170-180.  HTN: This she was out of BP medication for about a week. She is trying to eat healthy and walk with grand kids twice a week.  Denies any chest pains, shortness of breath and dizziness at present.  HPI   Past Medical History:  Diagnosis Date   Diabetes mellitus without complication    Hypertension     Past Surgical History:  Procedure Laterality Date   COLONOSCOPY WITH PROPOFOL N/A 11/14/2020   Procedure: COLONOSCOPY WITH PROPOFOL;  Surgeon: Toney Reil, MD;  Location: [facility] ENDOSCOPY;  Service: Gastroenterology;  Laterality: N/A;    Family History  Problem Relation Age of Onset   Breast cancer Neg Hx     Social History   Socioeconomic History   Marital status: Divorced    Spouse name: Not on file   Number of children: Not on file   Years of education: Not on file   Highest education level: Not on file  Occupational History   Not on file  Tobacco Use   Smoking status: Some Days    Types: Cigarettes   Smokeless tobacco: Never  Substance and Sexual Activity   Alcohol use: Not Currently    Comment: occasionaly   Drug use: Never   Sexual activity: Not Currently  Other Topics Concern   Not on file  Social History Narrative   Not on file   Social Determinants of Health   Financial Resource Strain: Not on file  Food Insecurity: Not on file  Transportation Needs: Not on file  Physical Activity: Not on file  Stress: Not  on file  Social Connections: Not on file  Intimate Partner Violence: Not on file     Outpatient Medications Prior to Visit  Medication Sig Dispense Refill   atorvastatin (LIPITOR) 40 MG tablet Take 40 mg by mouth daily.     lisinopril (ZESTRIL) 5 MG tablet TAKE 1 TABLET BY MOUTH ONCE DAILY (NEEDS  APPOINTMENT) 30 tablet 0   metFORMIN (GLUCOPHAGE) 1000 MG tablet Take 1,000 mg by mouth 2 (two) times daily.     metFORMIN (GLUCOPHAGE) 500 MG tablet Take 2 tablets (1,000 mg total) by mouth 1 day or 1 dose. (Patient not taking: Reported on 05/29/2022) 180 tablet 1   sitaGLIPtin (JANUVIA) 50 MG tablet Take 1 tablet (50 mg total) by mouth daily. (Patient not taking: Reported on 05/29/2022) 60 tablet 2   No facility-administered medications prior to visit.    Allergies  Allergen Reactions   Codeine Other (See Comments)    Too strong. Patient felt off    ROS Review of Systems  Constitutional: Negative.   HENT: Negative.    Eyes: Negative.   Respiratory:  Negative for cough.   Cardiovascular:  Negative for leg swelling.  Gastrointestinal: Negative.   Genitourinary: Negative.   Neurological: Negative.   Psychiatric/Behavioral: Negative.        Objective:    Physical Exam Constitutional:  Appearance: Normal appearance. She is overweight.  HENT:     Head: Normocephalic.     Right Ear: Tympanic membrane normal.     Left Ear: Tympanic membrane normal.     Nose: Nose normal.     Mouth/Throat:     Mouth: Mucous membranes are moist.     Pharynx: Oropharynx is clear.  Eyes:     Extraocular Movements: Extraocular movements intact.     Conjunctiva/sclera: Conjunctivae normal.     Pupils: Pupils are equal, round, and reactive to light.  Cardiovascular:     Rate and Rhythm: Normal rate and regular rhythm.     Pulses: Normal pulses.     Heart sounds: Normal heart sounds.  Pulmonary:     Effort: Pulmonary effort is normal. No respiratory distress.     Breath sounds: Normal breath  sounds. No rhonchi.  Abdominal:     General: Bowel sounds are normal.     Palpations: Abdomen is soft. There is no mass.     Tenderness: There is no abdominal tenderness.     Hernia: No hernia is present.  Musculoskeletal:        General: Normal range of motion.     Cervical back: Neck supple. No tenderness.  Skin:    General: Skin is warm.     Capillary Refill: Capillary refill takes less than 2 seconds.  Neurological:     General: No focal deficit present.     Mental Status: She is alert and oriented to person, place, and time. Mental status is at baseline.  Psychiatric:        Mood and Affect: Mood normal.        Behavior: Behavior normal.        Thought Content: Thought content normal.        Judgment: Judgment normal.     BP (!) 144/100   Pulse 90   Temp (!) 97.5 F (36.4 C) (Oral)   Ht 5\' 5"  (1.651 m)   Wt 161 lb 12.8 oz (73.4 kg)   SpO2 96%   BMI 26.92 kg/m  Wt Readings from Last 3 Encounters:  05/29/22 161 lb 12.8 oz (73.4 kg)  10/31/21 159 lb 4.8 oz (72.3 kg)  04/26/21 162 lb 3.2 oz (73.6 kg)     Health Maintenance  Topic Date Due   FOOT EXAM  Never done   HIV Screening  Never done   Diabetic kidney evaluation - Urine ACR  Never done   Hepatitis C Screening  Never done   DTaP/Tdap/Td (1 - Tdap) Never done   PAP SMEAR-Modifier  Never done   Lung Cancer Screening  Never done   Zoster Vaccines- Shingrix (1 of 2) Never done   Diabetic kidney evaluation - eGFR measurement  04/20/2022   HEMOGLOBIN A1C  05/01/2022   OPHTHALMOLOGY EXAM  05/18/2022   COVID-19 Vaccine (1) 12/17/2022 (Originally 10/22/1960)   INFLUENZA VACCINE  09/18/2022   MAMMOGRAM  04/29/2024   COLONOSCOPY (Pts 45-49yrs Insurance coverage will need to be confirmed)  11/14/2025   HPV VACCINES  Aged Out    There are no preventive care reminders to display for this patient.  No results found for: "TSH" Lab Results  Component Value Date   WBC 7.1 04/19/2021   HGB 13.0 04/19/2021   HCT 39.7  04/19/2021   MCV 83.1 04/19/2021   PLT 332 04/19/2021   Lab Results  Component Value Date   NA 140 04/19/2021   K 4.7 04/19/2021  CO2 24 04/19/2021   GLUCOSE 127 (H) 04/19/2021   BUN 23 04/19/2021   CREATININE 0.92 04/19/2021   BILITOT 0.5 04/19/2021   AST 15 04/19/2021   ALT 13 04/19/2021   PROT 6.8 04/19/2021   CALCIUM 10.0 04/19/2021   ANIONGAP 12 04/22/2019   EGFR 70 04/19/2021   Lab Results  Component Value Date   CHOL 102 04/19/2021   Lab Results  Component Value Date   HDL 40 (L) 04/19/2021   Lab Results  Component Value Date   LDLCALC 43 04/19/2021   Lab Results  Component Value Date   TRIG 106 04/19/2021   Lab Results  Component Value Date   CHOLHDL 2.6 04/19/2021   Lab Results  Component Value Date   HGBA1C 13.0 10/31/2021      Assessment & Plan:  Type 2 diabetes mellitus with other specified complication, without long-term current use of insulin Assessment & Plan: Advised pt to check the BS regularly, make a log and bring to next appointment.  Advised pt to monitor diet. Advised pt to eat variety of food including fruits, vegetables, whole grains, complex carbohydrates and proteins.  Will check Hg A1c    Orders: -     Microalbumin / creatinine urine ratio -     Comprehensive metabolic panel -     Hemoglobin A1c  Primary hypertension Assessment & Plan: Patient BP  Vitals:   05/29/22 1255 05/29/22 1329  BP: (!) 150/104 (!) 144/100    in the office 05/29/22  Advised pt to follow a low sodium and heart healthy diet. Continue lisinopril and will change medication if needed after the lab result.    Orders: -     Microalbumin / creatinine urine ratio -     Comprehensive metabolic panel -     CBC with Differential/Platelet -     TSH  Mixed hyperlipidemia Assessment & Plan: Last HDL 40, LDL 43,  triglycerides 100  on 04/19/21. Advised to follow heart healthy diet. Will check lipid panel.   Orders: -     Lipid panel -     LDL  cholesterol, direct    Follow-up: Return in about 2 weeks (around 06/12/2022) for nurse visit for BP check and 4 weeks for follow up on hypertension.   Kara Diesharanpreet  Delmar Arriaga, NP

## 2022-05-29 NOTE — Assessment & Plan Note (Signed)
Last HDL 40, LDL 43,  triglycerides 100  on 04/19/21. Advised to follow heart healthy diet. Will check lipid panel.

## 2022-05-29 NOTE — Assessment & Plan Note (Signed)
Advised pt to check the BS regularly, make a log and bring to next appointment.  Advised pt to monitor diet. Advised pt to eat variety of food including fruits, vegetables, whole grains, complex carbohydrates and proteins.  Will check Hg A1c

## 2022-05-30 LAB — HEMOGLOBIN A1C
Hgb A1c MFr Bld: 11.6 % of total Hgb — ABNORMAL HIGH (ref <5.7)
Mean Plasma Glucose: 286 mg/dL
eAG (mmol/L): 15.9 mmol/L

## 2022-06-03 NOTE — Progress Notes (Signed)
Please inform Ms. Mylin that her Hg A1c improved  from 13 to 11.6.

## 2022-06-03 NOTE — Progress Notes (Signed)
Her kidney function has decreased we will keep monitoring the kidney function. Increase fluid intake.  Liver function and cholesterol looks good. Good cholesterol is slightly on the lower side.

## 2022-06-18 ENCOUNTER — Other Ambulatory Visit: Payer: Self-pay | Admitting: Nurse Practitioner

## 2022-11-14 ENCOUNTER — Encounter: Payer: Self-pay | Admitting: Nurse Practitioner

## 2022-11-14 ENCOUNTER — Ambulatory Visit (INDEPENDENT_AMBULATORY_CARE_PROVIDER_SITE_OTHER): Payer: Medicaid Other | Admitting: Nurse Practitioner

## 2022-11-14 VITALS — BP 190/100 | HR 90 | Temp 98.5°F | Ht 65.0 in | Wt 158.8 lb

## 2022-11-14 DIAGNOSIS — Z7984 Long term (current) use of oral hypoglycemic drugs: Secondary | ICD-10-CM

## 2022-11-14 DIAGNOSIS — I10 Essential (primary) hypertension: Secondary | ICD-10-CM

## 2022-11-14 DIAGNOSIS — E1169 Type 2 diabetes mellitus with other specified complication: Secondary | ICD-10-CM

## 2022-11-14 DIAGNOSIS — E782 Mixed hyperlipidemia: Secondary | ICD-10-CM | POA: Diagnosis not present

## 2022-11-14 LAB — COMPREHENSIVE METABOLIC PANEL
ALT: 11 U/L (ref 0–35)
AST: 13 U/L (ref 0–37)
Albumin: 4.3 g/dL (ref 3.5–5.2)
Alkaline Phosphatase: 102 U/L (ref 39–117)
BUN: 13 mg/dL (ref 6–23)
CO2: 26 meq/L (ref 19–32)
Calcium: 9.6 mg/dL (ref 8.4–10.5)
Chloride: 103 meq/L (ref 96–112)
Creatinine, Ser: 0.84 mg/dL (ref 0.40–1.20)
GFR: 73.36 mL/min (ref >60.00)
Glucose, Bld: 285 mg/dL — ABNORMAL HIGH (ref 70–99)
Potassium: 4.2 meq/L (ref 3.5–5.1)
Sodium: 137 meq/L (ref 135–145)
Total Bilirubin: 0.6 mg/dL (ref 0.2–1.2)
Total Protein: 7.1 g/dL (ref 6.0–8.3)

## 2022-11-14 LAB — CBC WITH DIFFERENTIAL/PLATELET
Basophils Absolute: 0.1 10*3/uL (ref 0.0–0.1)
Basophils Relative: 0.9 % (ref 0.0–3.0)
Eosinophils Absolute: 0.2 10*3/uL (ref 0.0–0.7)
Eosinophils Relative: 4.2 % (ref 0.0–5.0)
HCT: 44.1 % (ref 36.0–46.0)
Hemoglobin: 14.8 g/dL (ref 12.0–15.0)
Lymphocytes Relative: 33.4 % (ref 12.0–46.0)
Lymphs Abs: 1.9 10*3/uL (ref 0.7–4.0)
MCHC: 33.5 g/dL (ref 30.0–36.0)
MCV: 80.8 fL (ref 78.0–100.0)
Monocytes Absolute: 0.3 10*3/uL (ref 0.1–1.0)
Monocytes Relative: 5.5 % (ref 3.0–12.0)
Neutro Abs: 3.2 10*3/uL (ref 1.4–7.7)
Neutrophils Relative %: 56 % (ref 43.0–77.0)
Platelets: 312 10*3/uL (ref 150.0–400.0)
RBC: 5.46 Mil/uL — ABNORMAL HIGH (ref 3.87–5.11)
RDW: 15.2 % (ref 11.5–15.5)
WBC: 5.7 10*3/uL (ref 4.0–10.5)

## 2022-11-14 LAB — LIPID PANEL
Cholesterol: 113 mg/dL (ref 0–200)
HDL: 35.6 mg/dL — ABNORMAL LOW (ref >39.00)
LDL Cholesterol: 52 mg/dL (ref 0–99)
NonHDL: 77.2
Total CHOL/HDL Ratio: 3
Triglycerides: 124 mg/dL (ref 0.0–149.0)
VLDL: 24.8 mg/dL (ref 0.0–40.0)

## 2022-11-14 LAB — HEMOGLOBIN A1C: Hgb A1c MFr Bld: 14.5 % — ABNORMAL HIGH (ref 4.6–6.5)

## 2022-11-14 MED ORDER — VALSARTAN 40 MG PO TABS: 40.0000 mg | ORAL_TABLET | Freq: Every day | ORAL | 3 refills | Status: DC

## 2022-11-14 MED ORDER — METOPROLOL SUCCINATE ER 25 MG PO TB24: 25.0000 mg | ORAL_TABLET | Freq: Every day | ORAL | 3 refills | Status: DC

## 2022-11-14 NOTE — Progress Notes (Unsigned)
Established Patient Office Visit  Subjective:  Patient ID: Barbara Larsen, female    DOB: August 03, 1960  Age: 62 y.o. MRN: [id number]  CC:  Chief Complaint  Patient presents with   Medical Management of Chronic Issues    HPI  Barbara Larsen presents for:  HPI   Past Medical History:  Diagnosis Date   Diabetes mellitus without complication (HCC)    Hypertension     Past Surgical History:  Procedure Laterality Date   COLONOSCOPY WITH PROPOFOL N/A 11/14/2020   Procedure: COLONOSCOPY WITH PROPOFOL;  Surgeon: Toney Reil, MD;  Location: [facility] ENDOSCOPY;  Service: Gastroenterology;  Laterality: N/A;    Family History  Problem Relation Age of Onset   Breast cancer Neg Hx     Social History   Socioeconomic History   Marital status: Divorced    Spouse name: Not on file   Number of children: Not on file   Years of education: Not on file   Highest education level: Not on file  Occupational History   Not on file  Tobacco Use   Smoking status: Some Days    Types: Cigarettes   Smokeless tobacco: Never  Substance and Sexual Activity   Alcohol use: Not Currently    Comment: occasionaly   Drug use: Never   Sexual activity: Not Currently  Other Topics Concern   Not on file  Social History Narrative   Not on file   Social Determinants of Health   Financial Resource Strain: Not on file  Food Insecurity: Not on file  Transportation Needs: Not on file  Physical Activity: Not on file  Stress: Not on file  Social Connections: Not on file  Intimate Partner Violence: Not on file     Outpatient Medications Prior to Visit  Medication Sig Dispense Refill   atorvastatin (LIPITOR) 40 MG tablet Take 40 mg by mouth daily.     lisinopril (ZESTRIL) 5 MG tablet TAKE 1 TABLET BY MOUTH ONCE DAILY (PT  NEEDS  APPOINTMENT) 30 tablet 5   metFORMIN (GLUCOPHAGE) 1000 MG tablet Take 1,000 mg by mouth 2 (two) times daily.     No facility-administered medications prior to visit.     Allergies  Allergen Reactions   Codeine Other (See Comments)    Too strong. Patient felt off    ROS Review of Systems Negative unless indicated in HPI.    Objective:    Physical Exam  BP (!) 170/115   Pulse 100   Temp 98.5 F (36.9 C) (Oral)   Ht 5\' 5"  (1.651 m)   Wt 158 lb 12.8 oz (72 kg)   SpO2 98%   BMI 26.43 kg/m  Wt Readings from Last 3 Encounters:  11/14/22 158 lb 12.8 oz (72 kg)  05/29/22 161 lb 12.8 oz (73.4 kg)  10/31/21 159 lb 4.8 oz (72.3 kg)     Health Maintenance  Topic Date Due   FOOT EXAM  Never done   HIV Screening  Never done   Hepatitis C Screening  Never done   DTaP/Tdap/Td (1 - Tdap) Never done   Cervical Cancer Screening (HPV/Pap Cotest)  Never done   Lung Cancer Screening  Never done   Zoster Vaccines- Shingrix (1 of 2) Never done   OPHTHALMOLOGY EXAM  05/18/2022   INFLUENZA VACCINE  Never done   COVID-19 Vaccine (1 - 2023-24 season) Never done   HEMOGLOBIN A1C  11/28/2022   Diabetic kidney evaluation - eGFR measurement  05/29/2023   Diabetic  kidney evaluation - Urine ACR  05/29/2023   MAMMOGRAM  04/29/2024   Colonoscopy  11/14/2025   HPV VACCINES  Aged Out    There are no preventive care reminders to display for this patient.  Lab Results  Component Value Date   TSH 0.67 05/29/2022   Lab Results  Component Value Date   WBC 5.6 05/29/2022   HGB 14.4 05/29/2022   HCT 41.9 05/29/2022   MCV 79.8 05/29/2022   PLT 330.0 05/29/2022   Lab Results  Component Value Date   NA 135 05/29/2022   K 3.8 05/29/2022   CO2 28 05/29/2022   GLUCOSE 311 (H) 05/29/2022   BUN 14 05/29/2022   CREATININE 1.01 05/29/2022   BILITOT 0.5 05/29/2022   ALKPHOS 114 05/29/2022   AST 13 05/29/2022   ALT 13 05/29/2022   PROT 7.3 05/29/2022   ALBUMIN 4.5 05/29/2022   CALCIUM 9.9 05/29/2022   ANIONGAP 12 04/22/2019   EGFR 70 04/19/2021   GFR 59.00 (L) 05/29/2022   Lab Results  Component Value Date   CHOL 110 05/29/2022   Lab Results   Component Value Date   HDL 35.60 (L) 05/29/2022   Lab Results  Component Value Date   LDLCALC 50 05/29/2022   Lab Results  Component Value Date   TRIG 118.0 05/29/2022   Lab Results  Component Value Date   CHOLHDL 3 05/29/2022   Lab Results  Component Value Date   HGBA1C 11.6 (H) 05/29/2022      Assessment & Plan:  There are no diagnoses linked to this encounter.  Follow-up: No follow-ups on file.   Kara Dies, NP

## 2022-11-14 NOTE — Patient Instructions (Addendum)
Increased lisinopril 10 mg daily  Will start on metoprolol 25 mg daily. Please monitor BP at home and bring the numbers in 1 weeks.

## 2022-11-17 NOTE — Assessment & Plan Note (Signed)
Patient BP  Vitals:   11/14/22 1129 11/14/22 1205 11/14/22 1212 11/14/22 1254  BP: (!) 170/115 (!) 200/112 (!) 206/130 (!) 190/100    in the office. Advised pt to follow a low sodium and heart healthy diet. No acute changes on the EKG.  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 Referral sent to cardiology for further evaluation. Continue lisinopril and will add metoprolol 25 Mg daily. Advised patient to continue to check blood pressure at home and bring readings to the next visit in 1 week.

## 2022-11-17 NOTE — Assessment & Plan Note (Signed)
Will check lipid panel.  Continue Lipitor for cardiovascular risk with reduction

## 2022-11-17 NOTE — Assessment & Plan Note (Signed)
Continue metformin. Will check hemoglobin A1c.

## 2022-11-21 ENCOUNTER — Encounter: Payer: Self-pay | Admitting: Nurse Practitioner

## 2022-11-21 ENCOUNTER — Ambulatory Visit (INDEPENDENT_AMBULATORY_CARE_PROVIDER_SITE_OTHER): Payer: Medicaid Other | Admitting: Nurse Practitioner

## 2022-11-21 VITALS — BP 140/90 | HR 107 | Temp 98.0°F | Ht 65.0 in | Wt 157.0 lb

## 2022-11-21 DIAGNOSIS — E1169 Type 2 diabetes mellitus with other specified complication: Secondary | ICD-10-CM

## 2022-11-21 DIAGNOSIS — Z7984 Long term (current) use of oral hypoglycemic drugs: Secondary | ICD-10-CM | POA: Diagnosis not present

## 2022-11-21 DIAGNOSIS — I10 Essential (primary) hypertension: Secondary | ICD-10-CM

## 2022-11-21 DIAGNOSIS — Z7985 Long-term (current) use of injectable non-insulin antidiabetic drugs: Secondary | ICD-10-CM | POA: Diagnosis not present

## 2022-11-21 MED ORDER — BLOOD GLUCOSE MONITORING SUPPL DEVI: 1.0000 | Freq: Three times a day (TID) | 0 refills | Status: AC

## 2022-11-21 MED ORDER — SEMAGLUTIDE(0.25 OR 0.5MG/DOS) 2 MG/3ML ~~LOC~~ SOPN: 0.2500 mg | PEN_INJECTOR | SUBCUTANEOUS | 2 refills | Status: DC

## 2022-11-21 MED ORDER — BLOOD GLUCOSE TEST VI STRP: 1.0000 | ORAL_STRIP | Freq: Three times a day (TID) | 0 refills | Status: AC

## 2022-11-21 MED ORDER — LISINOPRIL 10 MG PO TABS: 10.0000 mg | ORAL_TABLET | Freq: Every day | ORAL | 3 refills | Status: DC

## 2022-11-21 MED ORDER — LANCET DEVICE MISC: 1.0000 | Freq: Three times a day (TID) | 0 refills | Status: AC

## 2022-11-21 MED ORDER — LANCETS MISC. MISC: 1.0000 | Freq: Three times a day (TID) | 0 refills | Status: AC

## 2022-11-21 NOTE — Progress Notes (Unsigned)
Established Patient Office Visit  Subjective:  Patient ID: Barbara Larsen, female    DOB: 1960/02/22  Age: 62 y.o. MRN: [id number]  CC:  Chief Complaint  Patient presents with   Medical Management of Chronic Issues   Hypertension    HPI  Barbara Larsen presents for hypertension follow up and labs. She checked her BP twice  at home 9/28: 180,  9/30: 160 she does not remember the dystolic number.  She is taking lisinopril 10 mg but has not started metoprolol yet.   She is taking metformin for diabetes was started on januvia but is not taking due to sideeffect of dizziness.  Her Hg A1c 14.5. She denies family and personal  h/o MTC, MEN @ She do not check BS regularly.   HPI   Past Medical History:  Diagnosis Date   Diabetes mellitus without complication (HCC)    Hypertension     Past Surgical History:  Procedure Laterality Date   COLONOSCOPY WITH PROPOFOL N/A 11/14/2020   Procedure: COLONOSCOPY WITH PROPOFOL;  Surgeon: Toney Reil, MD;  Location: [facility] ENDOSCOPY;  Service: Gastroenterology;  Laterality: N/A;    Family History  Problem Relation Age of Onset   Breast cancer Neg Hx     Social History   Socioeconomic History   Marital status: Divorced    Spouse name: Not on file   Number of children: Not on file   Years of education: Not on file   Highest education level: Not on file  Occupational History   Not on file  Tobacco Use   Smoking status: Some Days    Types: Cigarettes   Smokeless tobacco: Never  Substance and Sexual Activity   Alcohol use: Not Currently    Comment: occasionaly   Drug use: Never   Sexual activity: Not Currently  Other Topics Concern   Not on file  Social History Narrative   Not on file   Social Determinants of Health   Financial Resource Strain: Not on file  Food Insecurity: Not on file  Transportation Needs: Not on file  Physical Activity: Not on file  Stress: Not on file  Social Connections: Not on file  Intimate  Partner Violence: Not on file     Outpatient Medications Prior to Visit  Medication Sig Dispense Refill   atorvastatin (LIPITOR) 40 MG tablet Take 40 mg by mouth daily.     lisinopril (ZESTRIL) 5 MG tablet Take 5 mg by mouth daily.     metFORMIN (GLUCOPHAGE) 1000 MG tablet Take 1,000 mg by mouth 2 (two) times daily.     metoprolol succinate (TOPROL-XL) 25 MG 24 hr tablet Take 1 tablet (25 mg total) by mouth daily. (Patient not taking: Reported on 11/21/2022) 90 tablet 3   No facility-administered medications prior to visit.    Allergies  Allergen Reactions   Codeine Other (See Comments)    Too strong. Patient felt off    ROS Review of Systems Negative unless indicated in HPI.    Objective:    Physical Exam  BP (!) 140/90   Pulse (!) 107   Temp 98 F (36.7 C) (Oral)   Ht 5\' 5"  (1.651 m)   Wt 157 lb (71.2 kg)   SpO2 98%   BMI 26.13 kg/m  Wt Readings from Last 3 Encounters:  11/21/22 157 lb (71.2 kg)  11/14/22 158 lb 12.8 oz (72 kg)  05/29/22 161 lb 12.8 oz (73.4 kg)     Health Maintenance  Topic  Date Due   FOOT EXAM  Never done   HIV Screening  Never done   Hepatitis C Screening  Never done   DTaP/Tdap/Td (1 - Tdap) Never done   Cervical Cancer Screening (HPV/Pap Cotest)  Never done   Lung Cancer Screening  Never done   Zoster Vaccines- Shingrix (1 of 2) Never done   OPHTHALMOLOGY EXAM  05/18/2022   INFLUENZA VACCINE  Never done   COVID-19 Vaccine (1 - 2023-24 season) Never done   HEMOGLOBIN A1C  05/14/2023   Diabetic kidney evaluation - Urine ACR  05/29/2023   Diabetic kidney evaluation - eGFR measurement  11/14/2023   MAMMOGRAM  04/29/2024   Colonoscopy  11/14/2025   HPV VACCINES  Aged Out    There are no preventive care reminders to display for this patient.  Lab Results  Component Value Date   TSH 0.67 05/29/2022   Lab Results  Component Value Date   WBC 5.7 11/14/2022   HGB 14.8 11/14/2022   HCT 44.1 11/14/2022   MCV 80.8 11/14/2022   PLT  312.0 11/14/2022   Lab Results  Component Value Date   NA 137 11/14/2022   K 4.2 11/14/2022   CO2 26 11/14/2022   GLUCOSE 285 (H) 11/14/2022   BUN 13 11/14/2022   CREATININE 0.84 11/14/2022   BILITOT 0.6 11/14/2022   ALKPHOS 102 11/14/2022   AST 13 11/14/2022   ALT 11 11/14/2022   PROT 7.1 11/14/2022   ALBUMIN 4.3 11/14/2022   CALCIUM 9.6 11/14/2022   ANIONGAP 12 04/22/2019   EGFR 70 04/19/2021   GFR 73.36 11/14/2022   Lab Results  Component Value Date   CHOL 113 11/14/2022   Lab Results  Component Value Date   HDL 35.60 (L) 11/14/2022   Lab Results  Component Value Date   LDLCALC 52 11/14/2022   Lab Results  Component Value Date   TRIG 124.0 11/14/2022   Lab Results  Component Value Date   CHOLHDL 3 11/14/2022   Lab Results  Component Value Date   HGBA1C 14.5 (H) 11/14/2022      Assessment & Plan:  There are no diagnoses linked to this encounter.  Follow-up: No follow-ups on file.   Kara Dies, NP

## 2022-11-21 NOTE — Patient Instructions (Addendum)
Take lisinopril 10 mg and metoroplol 25 mg daily. Please check blood pressure at home and bring the readings to the next appointment.  Please check your blood sugar at home twice fasting  and 2 hours after eating and bring the readings to next appointment. I have added Ozempic 0.25 mg weekly and continue to take metformin daily.

## 2022-11-24 ENCOUNTER — Telehealth: Payer: Self-pay

## 2022-11-24 NOTE — Telephone Encounter (Signed)
Pharmacy Patient Advocate Encounter   Received notification from Physician's Office that prior authorization for [facility] is required/requested.   Insurance verification completed.   The patient is insured through [facility] .   Per test claim: PA required; PA submitted to [facility] via CoverMyMeds Key/confirmation #/EOC Key: [facility]  Status is pending

## 2022-11-26 NOTE — Assessment & Plan Note (Signed)
Patient BP  Vitals:   11/21/22 1123  BP: (!) 140/90    in the office. Advised pt to follow a low sodium and heart healthy diet. Continue lisinopril 10 Mg and  metoprolol 25 Mg daily. Advised patient to continue to check blood pressure at home and bring readings to the next visit.

## 2022-11-26 NOTE — Assessment & Plan Note (Addendum)
Advised pt to check the BS regularly, make a log and bring to next appointment.  Advised pt to eat variety of food including fruits, vegetables, whole grains, complex carbohydrates and proteins. Lab Results  Component Value Date   HGBA1C 14.5 (H) 11/14/2022  Continue metformin will add Ozempic weekly. Patient denies any personal or family history of MTC or MEN 2

## 2022-11-27 NOTE — Telephone Encounter (Signed)
Pharmacy Patient Advocate Encounter  Received notification from [employer] that Prior Authorization for Ozempic has been DENIED.  Full denial letter will be uploaded to the media tab. See denial reason below.             PA #/Case ID/Reference #: [id number]

## 2022-12-19 ENCOUNTER — Ambulatory Visit: Payer: Medicaid Other | Admitting: Nurse Practitioner

## 2023-01-22 ENCOUNTER — Telehealth: Payer: Self-pay

## 2023-01-22 NOTE — Telephone Encounter (Signed)
The patient is scheduled to see Debbe Odea, MD on 01-28-2023 as a new patient. Called the telephone number on file, but was unable to leave a voice message to inquire about any previous cardiac history due to the voice mailbox being full.

## 2023-01-28 ENCOUNTER — Ambulatory Visit: Payer: Medicaid Other | Admitting: Cardiology

## 2023-02-03 ENCOUNTER — Other Ambulatory Visit: Payer: Self-pay

## 2023-02-03 ENCOUNTER — Emergency Department
Admission: EM | Admit: 2023-02-03 | Discharge: 2023-02-03 | Discharge status: Against Medical Advice | Payer: Medicaid Other | Attending: Student | Admitting: Student

## 2023-02-03 ENCOUNTER — Encounter: Payer: Self-pay | Admitting: Emergency Medicine

## 2023-02-03 DIAGNOSIS — R1010 Upper abdominal pain, unspecified: Secondary | ICD-10-CM | POA: Insufficient documentation

## 2023-02-03 DIAGNOSIS — R197 Diarrhea, unspecified: Secondary | ICD-10-CM | POA: Diagnosis not present

## 2023-02-03 DIAGNOSIS — K5900 Constipation, unspecified: Secondary | ICD-10-CM | POA: Insufficient documentation

## 2023-02-03 DIAGNOSIS — Z5321 Procedure and treatment not carried out due to patient leaving prior to being seen by health care provider: Secondary | ICD-10-CM | POA: Insufficient documentation

## 2023-02-03 LAB — CBC WITH DIFFERENTIAL/PLATELET
Abs Immature Granulocytes: 0.01 10*3/uL (ref 0.00–0.07)
Basophils Absolute: 0 10*3/uL (ref 0.0–0.1)
Basophils Relative: 1 %
Eosinophils Absolute: 0.2 10*3/uL (ref 0.0–0.5)
Eosinophils Relative: 3 %
HCT: 38.7 % (ref 36.0–46.0)
Hemoglobin: 13.6 g/dL (ref 12.0–15.0)
Immature Granulocytes: 0 %
Lymphocytes Relative: 35 %
Lymphs Abs: 1.9 10*3/uL (ref 0.7–4.0)
MCH: 27.1 pg (ref 26.0–34.0)
MCHC: 35.1 g/dL (ref 30.0–36.0)
MCV: 77.1 fL — ABNORMAL LOW (ref 80.0–100.0)
Monocytes Absolute: 0.4 10*3/uL (ref 0.1–1.0)
Monocytes Relative: 7 %
Neutro Abs: 3 10*3/uL (ref 1.7–7.7)
Neutrophils Relative %: 54 %
Platelets: 300 10*3/uL (ref 150–400)
RBC: 5.02 MIL/uL (ref 3.87–5.11)
RDW: 14.2 % (ref 11.5–15.5)
WBC: 5.6 10*3/uL (ref 4.0–10.5)
nRBC: 0 % (ref 0.0–0.2)

## 2023-02-03 LAB — URINALYSIS, ROUTINE W REFLEX MICROSCOPIC
Bacteria, UA: NONE SEEN
Bilirubin Urine: NEGATIVE
Glucose, UA: >=500 mg/dL — AB
Hgb urine dipstick: NEGATIVE
Ketones, ur: NEGATIVE mg/dL
Leukocytes,Ua: NEGATIVE
Nitrite: NEGATIVE
Protein, ur: NEGATIVE mg/dL
Specific Gravity, Urine: 1.017 (ref 1.005–1.030)
pH: 6 (ref 5.0–8.0)

## 2023-02-03 LAB — COMPREHENSIVE METABOLIC PANEL
ALT: 16 U/L (ref 0–44)
AST: 17 U/L (ref 15–41)
Albumin: 3.9 g/dL (ref 3.5–5.0)
Alkaline Phosphatase: 99 U/L (ref 38–126)
Anion gap: 11 (ref 5–15)
BUN: 13 mg/dL (ref 8–23)
CO2: 25 mmol/L (ref 22–32)
Calcium: 9.1 mg/dL (ref 8.9–10.3)
Chloride: 95 mmol/L — ABNORMAL LOW (ref 98–111)
Creatinine, Ser: 0.97 mg/dL (ref 0.44–1.00)
GFR, Estimated: >60 mL/min (ref >60)
Glucose, Bld: 467 mg/dL — ABNORMAL HIGH (ref 70–99)
Potassium: 4.2 mmol/L (ref 3.5–5.1)
Sodium: 131 mmol/L — ABNORMAL LOW (ref 135–145)
Total Bilirubin: 0.6 mg/dL (ref <1.2)
Total Protein: 6.9 g/dL (ref 6.5–8.1)

## 2023-02-03 LAB — LIPASE, BLOOD: Lipase: 92 U/L — ABNORMAL HIGH (ref 11–51)

## 2023-02-03 NOTE — ED Triage Notes (Signed)
Patient to ED via POV for mid, upper abd pain. States ongoing since last Monday with diarrhea. States she was constipated prior to all of this started. Denies N/V.

## 2023-02-03 NOTE — ED Provider Triage Note (Signed)
Emergency Medicine Provider Triage Evaluation Note  Barbara Larsen , a 62 y.o. female  was evaluated in triage.  Pt complains of upper abdominal pain for over a week with diarrhea.  Review of Systems  Positive: Abd pain, diarrhea Negative: Nausea, vomiting  Physical Exam  There were no vitals taken for this visit. Gen:   Awake, no distress   Resp:  Normal effort  MSK:   Moves extremities without difficulty  Other:    Medical Decision Making  Medically screening exam initiated at 5:29 PM.  Appropriate orders placed.  Barbara Larsen was informed that the remainder of the evaluation will be completed by another provider, this initial triage assessment does not replace that evaluation, and the importance of remaining in the ED until their evaluation is complete.     Cameron Ali, PA-C 02/03/23 1730

## 2023-02-06 ENCOUNTER — Encounter: Payer: Self-pay | Admitting: Nurse Practitioner

## 2023-02-06 ENCOUNTER — Ambulatory Visit: Payer: Medicaid Other | Admitting: Nurse Practitioner

## 2023-02-06 VITALS — BP 150/88 | HR 114 | Temp 98.0°F | Ht 67.0 in | Wt 154.2 lb

## 2023-02-06 DIAGNOSIS — E1169 Type 2 diabetes mellitus with other specified complication: Secondary | ICD-10-CM

## 2023-02-06 DIAGNOSIS — I10 Essential (primary) hypertension: Secondary | ICD-10-CM

## 2023-02-06 DIAGNOSIS — Z7984 Long term (current) use of oral hypoglycemic drugs: Secondary | ICD-10-CM | POA: Diagnosis not present

## 2023-02-06 DIAGNOSIS — R1011 Right upper quadrant pain: Secondary | ICD-10-CM

## 2023-02-06 DIAGNOSIS — Z1231 Encounter for screening mammogram for malignant neoplasm of breast: Secondary | ICD-10-CM | POA: Diagnosis not present

## 2023-02-06 DIAGNOSIS — Z7985 Long-term (current) use of injectable non-insulin antidiabetic drugs: Secondary | ICD-10-CM | POA: Diagnosis not present

## 2023-02-06 DIAGNOSIS — R748 Abnormal levels of other serum enzymes: Secondary | ICD-10-CM | POA: Diagnosis not present

## 2023-02-06 DIAGNOSIS — Z0000 Encounter for general adult medical examination without abnormal findings: Secondary | ICD-10-CM | POA: Diagnosis not present

## 2023-02-06 LAB — COMPREHENSIVE METABOLIC PANEL
ALT: 11 U/L (ref 0–35)
AST: 11 U/L (ref 0–37)
Albumin: 4.3 g/dL (ref 3.5–5.2)
Alkaline Phosphatase: 111 U/L (ref 39–117)
BUN: 13 mg/dL (ref 6–23)
CO2: 27 meq/L (ref 19–32)
Calcium: 9.3 mg/dL (ref 8.4–10.5)
Chloride: 101 meq/L (ref 96–112)
Creatinine, Ser: 0.92 mg/dL (ref 0.40–1.20)
GFR: 65.67 mL/min (ref >60.00)
Glucose, Bld: 424 mg/dL — ABNORMAL HIGH (ref 70–99)
Potassium: 4.5 meq/L (ref 3.5–5.1)
Sodium: 135 meq/L (ref 135–145)
Total Bilirubin: 0.5 mg/dL (ref 0.2–1.2)
Total Protein: 7.1 g/dL (ref 6.0–8.3)

## 2023-02-06 LAB — LIPID PANEL
Cholesterol: 96 mg/dL (ref 0–200)
HDL: 29.7 mg/dL — ABNORMAL LOW (ref >39.00)
LDL Cholesterol: 39 mg/dL (ref 0–99)
NonHDL: 66.34
Total CHOL/HDL Ratio: 3
Triglycerides: 137 mg/dL (ref 0.0–149.0)
VLDL: 27.4 mg/dL (ref 0.0–40.0)

## 2023-02-06 LAB — TSH: TSH: 0.79 u[IU]/mL (ref 0.35–5.50)

## 2023-02-06 LAB — LIPASE: Lipase: 69 U/L — ABNORMAL HIGH (ref 11.0–59.0)

## 2023-02-06 NOTE — Progress Notes (Signed)
Established Patient Office Visit  Subjective:  Patient ID: Barbara Larsen, female    DOB: 07-Sep-1960  Age: 62 y.o. MRN: [id number]  CC:  Chief Complaint  Patient presents with   Constipation   Hypertension   Annual Exam    HPI  Patient presents to the clinic for his/her annual physical exam.  Flu: declined Tetanus: declined COVID: none Dentist: last month Eye examination: Due Exercise:walking but not is last few weeks   Diet: Patient does eat meat. Patient consumes fruits and veggies. Patient eat some fried food. Patient drinks water and  vitamin drink  Barbara Larsen presents for intermittent R abdominal pain since last week. She went to the ED three days ago had blood work but LWBS. She had elevated lipase level. She states the pain is better now. She has intermittent constipation and diarrhea.   Diabetes: last hemoglobin A1c 14.5, she is not taking ozempic only taking metformin.  She does not check blood sugar at home.  Hypertension.  Patient did not started metoprolol that was initiated last time she is only taking lisinopril for blood pressure control.     Past Medical History:  Diagnosis Date   Diabetes mellitus without complication (HCC)    Hypertension     Past Surgical History:  Procedure Laterality Date   COLONOSCOPY WITH PROPOFOL N/A 11/14/2020   Procedure: COLONOSCOPY WITH PROPOFOL;  Surgeon: Toney Reil, MD;  Location: [facility] ENDOSCOPY;  Service: Gastroenterology;  Laterality: N/A;    Family History  Problem Relation Age of Onset   Breast cancer Neg Hx     Social History   Socioeconomic History   Marital status: Divorced    Spouse name: Not on file   Number of children: Not on file   Years of education: Not on file   Highest education level: Not on file  Occupational History   Not on file  Tobacco Use   Smoking status: Some Days    Types: Cigarettes   Smokeless tobacco: Never  Substance and Sexual Activity   Alcohol use: Not  Currently    Comment: occasionaly   Drug use: Never   Sexual activity: Not Currently  Other Topics Concern   Not on file  Social History Narrative   Not on file   Social Drivers of Health   Financial Resource Strain: Not on file  Food Insecurity: Not on file  Transportation Needs: Not on file  Physical Activity: Not on file  Stress: Not on file  Social Connections: Not on file  Intimate Partner Violence: Not on file     Outpatient Medications Prior to Visit  Medication Sig Dispense Refill   atorvastatin (LIPITOR) 40 MG tablet Take 40 mg by mouth daily.     Blood Glucose Monitoring Suppl DEVI 1 each by Does not apply route in the morning, at noon, and at bedtime. May substitute to any manufacturer covered by patient's insurance. 1 each 0   lisinopril (ZESTRIL) 10 MG tablet Take 1 tablet (10 mg total) by mouth daily. 90 tablet 3   metFORMIN (GLUCOPHAGE) 1000 MG tablet Take 1,000 mg by mouth 2 (two) times daily.     metoprolol succinate (TOPROL-XL) 25 MG 24 hr tablet Take 1 tablet (25 mg total) by mouth daily. 90 tablet 3   Semaglutide,0.25 or 0.5MG /DOS, 2 MG/3ML SOPN Inject 0.25 mg into the skin once a week. (Patient not taking: Reported on 02/06/2023) 3 mL 2   No facility-administered medications prior to visit.  Allergies  Allergen Reactions   Codeine Other (See Comments)    Too strong. Patient felt off    ROS Review of Systems  Gastrointestinal:  Positive for constipation.   Negative unless indicated in HPI.    Objective:    Physical Exam Constitutional:      Appearance: Normal appearance. She is normal weight.  HENT:     Head: Normocephalic.     Right Ear: Tympanic membrane normal.     Left Ear: Tympanic membrane normal.     Nose: Nose normal.     Mouth/Throat:     Mouth: Mucous membranes are moist.  Eyes:     Extraocular Movements: Extraocular movements intact.     Conjunctiva/sclera: Conjunctivae normal.     Pupils: Pupils are equal, round, and  reactive to light.  Neck:     Thyroid: No thyroid mass or thyroid tenderness.  Cardiovascular:     Rate and Rhythm: Normal rate and regular rhythm.     Pulses: Normal pulses.     Heart sounds: Normal heart sounds. No murmur heard. Pulmonary:     Effort: Pulmonary effort is normal.     Breath sounds: Normal breath sounds.  Abdominal:     General: Bowel sounds are normal.     Palpations: Abdomen is soft. There is no mass.     Tenderness: There is no abdominal tenderness. There is no rebound.  Musculoskeletal:        General: No swelling.     Cervical back: Neck supple. No tenderness.     Right lower leg: No edema.     Left lower leg: No edema.  Skin:    Findings: No bruising, erythema or rash.  Neurological:     General: No focal deficit present.     Mental Status: She is alert and oriented to person, place, and time. Mental status is at baseline.  Psychiatric:        Mood and Affect: Mood normal.        Behavior: Behavior normal.        Thought Content: Thought content normal.        Judgment: Judgment normal.     BP (!) 150/88   Pulse (!) 114   Temp 98 F (36.7 C) (Oral)   Ht 5\' 7"  (1.702 m)   Wt 154 lb 3.2 oz (69.9 kg)   SpO2 99%   BMI 24.15 kg/m  Wt Readings from Last 3 Encounters:  02/06/23 154 lb 3.2 oz (69.9 kg)  02/03/23 160 lb (72.6 kg)  11/21/22 157 lb (71.2 kg)     Health Maintenance  Topic Date Due   FOOT EXAM  Never done   HIV Screening  Never done   Hepatitis C Screening  Never done   DTaP/Tdap/Td (1 - Tdap) Never done   Cervical Cancer Screening (HPV/Pap Cotest)  Never done   Lung Cancer Screening  Never done   Zoster Vaccines- Shingrix (1 of 2) Never done   OPHTHALMOLOGY EXAM  05/18/2022   COVID-19 Vaccine (1 - 2024-25 season) Never done   INFLUENZA VACCINE  05/18/2023 (Originally 09/18/2022)   HEMOGLOBIN A1C  05/14/2023   Diabetic kidney evaluation - Urine ACR  05/29/2023   Diabetic kidney evaluation - eGFR measurement  02/06/2024    MAMMOGRAM  04/29/2024   Colonoscopy  11/14/2025   HPV VACCINES  Aged Out    There are no preventive care reminders to display for this patient.  Lab Results  Component Value Date  TSH 0.79 02/06/2023   Lab Results  Component Value Date   WBC 5.6 02/03/2023   HGB 13.6 02/03/2023   HCT 38.7 02/03/2023   MCV 77.1 (L) 02/03/2023   PLT 300 02/03/2023   Lab Results  Component Value Date   NA 135 02/06/2023   K 4.5 02/06/2023   CO2 27 02/06/2023   GLUCOSE 424 (H) 02/06/2023   BUN 13 02/06/2023   CREATININE 0.92 02/06/2023   BILITOT 0.5 02/06/2023   ALKPHOS 111 02/06/2023   AST 11 02/06/2023   ALT 11 02/06/2023   PROT 7.1 02/06/2023   ALBUMIN 4.3 02/06/2023   CALCIUM 9.3 02/06/2023   ANIONGAP 11 02/03/2023   EGFR 70 04/19/2021   GFR 65.67 02/06/2023   Lab Results  Component Value Date   CHOL 96 02/06/2023   Lab Results  Component Value Date   HDL 29.70 (L) 02/06/2023   Lab Results  Component Value Date   LDLCALC 39 02/06/2023   Lab Results  Component Value Date   TRIG 137.0 02/06/2023   Lab Results  Component Value Date   CHOLHDL 3 02/06/2023   Lab Results  Component Value Date   HGBA1C 14.5 (H) 11/14/2022      Assessment & Plan:  Elevated lipase -     Lipase  Right upper quadrant abdominal pain Assessment & Plan: Intermittent right upper quadrant pain.  Patient had elevated lipase level.  Will recheck lipase level, CMP and lipid panel. Advised patient to let us know if symptoms not improving   Annual physical exam Assessment & Plan: Encouraged patient to consume a balanced diet and regular exercise regimen. Advised to see an eye doctor and dentist annually.  Declined flu and tetanus vaccine.  Orders: -     Comprehensive metabolic panel -     Lipid panel -     TSH  Screening mammogram, encounter for -     3D Screening Mammogram, Left and Right; Future  Type 2 diabetes mellitus with other specified complication, without long-term current  use of insulin (HCC) Assessment & Plan: Advised patient to start back on Ozempic and metformin.  Advised patient to check blood sugar at home and bring the readings during next appointment. Close follow-up.   Primary hypertension Assessment & Plan: Called pharmacy and verified that the prescription for metoprolol and lisinopril is ready for pickup advised patient to pick up the medication and start taking it continuously. Patient will check blood pressure at home and bring the readings during the next visit.  Will continue to monitor     Follow-up: Return in about 2 weeks (around 02/20/2023) for hypertension, diabetes.   Kara Dies, NP

## 2023-02-06 NOTE — Patient Instructions (Addendum)
Take Miralax 1/2 -1 cup as needed for constipation. Check BP and BS at home and bring the number to next visit in 2 weeks. Start taking the ozempic weekly and pick the medication from [facility], we called the pharamcy and medication are ready. Metroprolol, losartan and metformin

## 2023-02-16 DIAGNOSIS — Z0000 Encounter for general adult medical examination without abnormal findings: Secondary | ICD-10-CM | POA: Insufficient documentation

## 2023-02-16 DIAGNOSIS — R1011 Right upper quadrant pain: Secondary | ICD-10-CM | POA: Insufficient documentation

## 2023-02-16 NOTE — Assessment & Plan Note (Signed)
Advised patient to start back on Ozempic and metformin.  Advised patient to check blood sugar at home and bring the readings during next appointment. Close follow-up.

## 2023-02-16 NOTE — Assessment & Plan Note (Signed)
Called pharmacy and verified that the prescription for metoprolol and lisinopril is ready for pickup advised patient to pick up the medication and start taking it continuously. Patient will check blood pressure at home and bring the readings during the next visit.  Will continue to monitor

## 2023-02-16 NOTE — Assessment & Plan Note (Signed)
Encouraged patient to consume a balanced diet and regular exercise regimen. Advised to see an eye doctor and dentist annually.  Declined flu and tetanus vaccine.

## 2023-02-16 NOTE — Assessment & Plan Note (Addendum)
Intermittent right upper quadrant pain.  Patient had elevated lipase level.  Will recheck lipase level, CMP and lipid panel. Advised patient to let us know if symptoms not improving

## 2023-02-25 ENCOUNTER — Telehealth: Payer: Self-pay

## 2023-02-25 NOTE — Progress Notes (Signed)
 Care Guide Pharmacy Note  02/25/2023 Name: DANIELLE MINK MRN: [id number] DOB: Mar 22, 1960  Referred By: Vincente Saber, NP Reason for referral: Care Coordination (TNM Diabetes.)   TAKESHA STEGER is a 63 y.o. year old female who is a primary care patient of Vincente Saber, NP.  Lauraine MARLA Collet was referred to the pharmacist for assistance related to: DMII  Successful contact was made with the patient to discuss pharmacy services including being ready for the pharmacist to call at least 5 minutes before the scheduled appointment time and to have medication bottles and any blood pressure readings ready for review. The patient agreed to meet with the pharmacist via telephone visit on (date/time). 03/02/23 at 10:00 a.m.   Dreama [facility] Assistant [phone]

## 2023-03-02 ENCOUNTER — Other Ambulatory Visit (INDEPENDENT_AMBULATORY_CARE_PROVIDER_SITE_OTHER): Payer: Medicaid Other | Admitting: Pharmacist

## 2023-03-02 DIAGNOSIS — E1169 Type 2 diabetes mellitus with other specified complication: Secondary | ICD-10-CM

## 2023-03-02 DIAGNOSIS — Z7984 Long term (current) use of oral hypoglycemic drugs: Secondary | ICD-10-CM

## 2023-03-02 NOTE — Progress Notes (Signed)
 03/03/2023 Name: Barbara Larsen MRN: [id number] DOB: 29-Apr-1960  Subjective  Chief Complaint  Patient presents with   Diabetes   Hypertension   Hyperlipidemia    Reason for visit: ?  Barbara Larsen is a 63 y.o. female with a history of diabetes (type 2), who presents today for an initial diabetes pharmacotherapy visit.? Pertinent PMH also includes HTN, HLD.  Known DM Complications: no known complications   Care Team: Primary Care Provider: Vincente Saber, NP   Date of Last Diabetes Related Visit: with PCP on 02/06/23   Recent Summary of Change: 12/20 PCP Diabetes: A1c 14.5, she is not taking ozempic  only taking metformin . She does not check blood sugar at home. Advised patient to start back on Ozempic  and metformin .  HTN: Called pharmacy and verified that the prescription for metoprolol  and lisinopril  is ready for pickup advised patient to pick up the medication and start taking it continuously. RTC 2 weeks - 03/06/23  Medication Access/Adherence: Prescription drug coverage: Payor: [place] MEDICAID PREPAID HEALTH PLAN / Plan: [place] MEDICAID HEALTHY BLUE / Product Type: *No Product type* / . Medicare HealthBlue     - Ozempic  PA Denied 11/2022 due to requirement to fail 3 preferred agents ( Key: [facility] ); Now has Medicaid, should be covered. - Reports that current medications are affordable.  - Current Patient Assistance: None (Medicaid, non-eligible) - Medication Adherence: Patient reports missing doses of their medication, primarily evening doses. Feels this has improved since last PCP visit.     Since Last visit / History of Present Illness: ?  Patient reports implementing plan from last visit. Has been taking metformin . Denies ever filling an Rx for Ozempic . Was given a sample in clinic a while ago which caused a knot at injection site without any c/f GI effects (took ~3 doses total).  Reported DM Regimen: ?  metformin  1000 mg twice daily with meals - Last filled 12/15/22 x30 ds  (patient reports she had medicine left over from previous missed doses)   DM medications tried in the past:?  Januvia  (Pt reported Dizziness - today patient denies ever taking this) Semaglutide  (sample from clinic caused skin reaction -- red know). Has never been filled at the pharmacy.    SMBG: Not checking ?  Confirms she recently picked up all supplies needed for her glucometer (test strips, lancets, etc.). Denies need of any prescription refills or missing components. States she will start checking this week.    Hypo/Hyperglycemia: ?  Symptoms of hypoglycemia since last visit:? no  If yes, it was treated by: n/a  Symptoms of hyperglycemia since last visit:? no - Reports that when her sugars are very elevated she experiences increased urination. Not an issue currently, urinates 0 times over night.   DM Prevention:  Statin: Taking; high intensity Poor fill history  History of chronic kidney disease? no History of albuminuria? no, last UACR on 05/29/22 = 3.3 mg/g ACE/ARB - Taking lisinopril ; Urine MA/CR Ratio - normal.  Last eye exam: 05/17/21; No retinopathy present - DUE Last foot exam: No foot exam found Tobacco Use: Occasional smoker  Immunizations:? No record of immunizations  Cardiovascular Risk Reduction History of clinical ASCVD? no History of heart failure? no History of hyperlipidemia? yes Current BMI: 24.1 kg/m2 (Ht 67 in, Wt 69.9 kg) Taking statin? yes Taking aspirin? not indicated; Not taking   Taking SGLT-2i? no Taking GLP- 1 RA? no   Hypertension Reported HTN Regimen: ?  lisinopril  10 mg daily (night) Metoprolol  succinate 25  mg daily - Filled 02/06/23 x30 ds -- Not taking due to having crazy thoughts.. messed with my mind   HTN medications tried in the past:?  Lisinopril -hydrochlorothiazide (Patient cannot recall taking. Fill hx shows taking regularly 03/2020 - 07/2021. Prescribed previously by [facility] PCP Rock Bosch, PA)  Patient takes their blood pressure  medications in the evening Patient is not checking their blood pressure at home regularly. Reports she recently obtained a BP cuff and her daughter is going to help her set it up this afternoon.  Patient denies hypotensive s/sx. No dizziness, lightheadedness.  Patient denies hypertensive symptoms. No headache, chest pain, shortness of breath, visual changes.      _______________________________________________  Objective    Review of Systems:? Limited in the setting of virtual visit  Constitutional:? No fever, chills or unintentional weight loss  Cardiovascular:? No chest pain or pressure, shortness of breath, dyspnea on exertion, orthopnea or LE edema  Pulmonary:? No cough or shortness of breath  GI:? constipation, ; No nausea, vomiting, diarrhea, abdominal pain, dyspepsia, change in bowel habits  Endocrine:? No polyuria, polyphagia or blurred vision    Physical Examination:  Vitals:  Wt Readings from Last 3 Encounters:  02/06/23 154 lb 3.2 oz (69.9 kg)  02/03/23 160 lb (72.6 kg)  11/21/22 157 lb (71.2 kg)   BP Readings from Last 3 Encounters:  02/06/23 (!) 150/88  02/03/23 (!) 167/88  11/21/22 (!) 140/90   Pulse Readings from Last 3 Encounters:  02/06/23 (!) 114  02/03/23 (!) 101  11/21/22 (!) 107     Labs:?  Lab Results  Component Value Date   HGBA1C 14.5 (H) 11/14/2022   HGBA1C 11.6 (H) 05/29/2022   HGBA1C 13.0 10/31/2021   GLUCOSE 424 (H) 02/06/2023   MICRALBCREAT 3.3 05/29/2022   CREATININE 0.92 02/06/2023   CREATININE 0.97 02/03/2023   CREATININE 0.84 11/14/2022   GFR 65.67 02/06/2023   GFR 73.36 11/14/2022   GFR 59.00 (L) 05/29/2022    Lab Results  Component Value Date   CHOL 96 02/06/2023   LDLCALC 39 02/06/2023   LDLCALC 52 11/14/2022   LDLCALC 50 05/29/2022   LDLDIRECT 57.0 05/29/2022   HDL 29.70 (L) 02/06/2023   TRIG 137.0 02/06/2023   TRIG 124.0 11/14/2022   TRIG 118.0 05/29/2022   ALT 11 02/06/2023   ALT 16 02/03/2023   AST 11 02/06/2023    AST 17 02/03/2023      Chemistry      Component Value Date/Time   NA 135 02/06/2023 1119   K 4.5 02/06/2023 1119   CL 101 02/06/2023 1119   CO2 27 02/06/2023 1119   BUN 13 02/06/2023 1119   CREATININE 0.92 02/06/2023 1119   CREATININE 0.92 04/19/2021 1225      Component Value Date/Time   CALCIUM  9.3 02/06/2023 1119   ALKPHOS 111 02/06/2023 1119   AST 11 02/06/2023 1119   ALT 11 02/06/2023 1119   BILITOT 0.5 02/06/2023 1119       The ASCVD Risk score (Arnett DK, et al., 2019) failed to calculate for the following reasons:   The valid total cholesterol range is 130 to 320 mg/dL  Assessment and Plan:   1. Diabetes, type 2: uncontrolled per last A1c of 14.5% (11/14/22), increased from previous 11.6% (05/29/22) with goal <7% without hypoglycemia. Hyperglycemia 2/2 not taking prescribed medication. Adherence barriers identified: Forgets doses, especially night doses due to falling asleep. Feels this has improved.  Consider 90 day supply of chronic medications Consider adherence packaging Glucometer:  Not checking. Confirms recently picked up all supplies needed. Will start checking.  Current Regimen: metformin  1000 mg BID w meals Future Consideration: GLP1-RA: Reports knot at injection site w Ozempic . PA for Ozempic  denied in October.  Consider alternative GLP1: Trulicity  PA request sent to PA team.  Future: Mounjaro non-preferred though covered if unable to tolerate/take 2 preferred GLP1s (Ozempic , Trulicity ) SGLT2i: Reasonable in the future though defer to when A1c closer to goal due to risk volume depletion/dehydration.  SU: Not unreasonable at this time given significant hyperglycemia. Medicaid preferred: Glipizide IR/ER tablet Glipizide-metformin  combination tablet TZD: Not unreasonable. Defer given possible weight gain/increase in fracture risk with more potent options at this time. Insulin : Not unreasonable, defer as last resort given risk hypoglycemia and historically poor  compliance. Other safer options at this time.   2. HTN: uncontrolled based on last clinic BP of 150/88 mmHg (02/06/23) 2/2 non-compliance to medications, goal <130/80 mmHg. Does not monitor BP at home though recently obtained a BP cuff. Denies lightheadedness, dizziness, SOB, CP, vision changes. Confirms resuming metoprolol  and lisinopril  as instructed at last PCP visit though since stopped metoprolol  due to perceived adverse effects of change in thoughts.  Current Regimen: lisinopril  10 mg daily Continue medications. Discuss at PCP f/u 03/06/23 Future Consideration: - Lisinopril : Room for optimization of current dose. K and eGFR remain WNL.  - CCB: amlodipine reasonable addition given minimal effect on renal/lytes.  - hydrochlorothiazide: Cl and Na historically lower end of normal to slightly low. Not unreasonable, though may req closer monitoring d/t possible increase risk of electrolyte abnormality.  - Beta blocker: Consider if intolerant to (or in addition to) first line therapies above   3. ASCVD (primary prevention): LDL at goal on last lipid panel with LDL 39 mg/dL, TG 862 mg/dL (87/79/75). LDL goal <70 mg/dL (primary prevention, diabetes).  Key risk factors include: diabetes, hypertension, hyperlipidemia (well-controlled), and sedentary lifestyle Current Regimen: Atorvastatin  40 mg daily (poor compliance per refill history though LDL well-controlled) Continue medications today without changes.    Follow Up Follow up with PCP scheduled this week. Hopeful PA for Trulicity  will be approved by that time. Follow up with Pharm ~1 mo after PCP for DM med titration   Future Appointments  Date Time Provider Department Center  03/06/2023 11:20 AM Vincente Saber, NP [facility]  03/20/2023 11:00 AM Darliss Rogue, MD [facility] None    Manuelita FABIENE Kobs, PharmD Clinical Pharmacist [facility] [phone]

## 2023-03-02 NOTE — Patient Instructions (Addendum)
 Ms. Barbara Larsen,   It was a pleasure to speak with you today! As we discussed:?   Continue metformin  as you have been taking it (1000 mg with breakfast and with dinner) Continue lisinopril  10 mg daily Continue atorvastatin  40 mg daily  Start checking your blood pressure at home and write down your numbers. Please bring these numbers to your upcoming visit.   As discussed, I will work with your insurance to see if a different medication may be covered that is similar to Ozempic . Once we hear back from the insurance, I will give you a call to discuss this further.   Please reach out prior to your next scheduled appointment should you have any questions or concerns.  Thank you!   Future Appointments  Date Time Provider Department Center  03/06/2023 11:20 AM Vincente Saber, NP [facility]  03/20/2023 11:00 AM Darliss Rogue, MD [facility] None    Manuelita FABIENE Kobs, PharmD Clinical Pharmacist [facility] [phone]

## 2023-03-04 ENCOUNTER — Other Ambulatory Visit (HOSPITAL_COMMUNITY): Payer: Self-pay

## 2023-03-04 ENCOUNTER — Telehealth: Payer: Self-pay

## 2023-03-04 NOTE — Telephone Encounter (Signed)
-----   Message from Daron Ellen sent at 03/02/2023 11:56 AM EST ----- Good morning,   Could a PA for Trulicity  be initiated for this patient? Should be approved based on insufficient response to max dose metformin . Thank you!

## 2023-03-04 NOTE — Telephone Encounter (Signed)
 Pharmacy Patient Advocate Encounter   Received notification from Physician's Office that prior authorization for Trulicity  0.75MG /0.5ML auto-injectors is required/requested.   Insurance verification completed.   The patient is insured through [facility] [facility] .   Per test claim: PA required and submitted KEY/EOC/Request #: [id number] APPROVED from 03/04/23 to 03/03/24. Ran test claim, Copay is $4. This test claim was processed through [facility]- copay amounts may vary at other pharmacies due to pharmacy/plan contracts, or as the patient moves through the different stages of their insurance plan.   PA Case: [id number]

## 2023-03-06 ENCOUNTER — Encounter: Payer: Self-pay | Admitting: Nurse Practitioner

## 2023-03-06 ENCOUNTER — Ambulatory Visit: Payer: Medicaid Other | Admitting: Nurse Practitioner

## 2023-03-06 ENCOUNTER — Other Ambulatory Visit: Payer: Self-pay

## 2023-03-06 VITALS — BP 122/80 | HR 107 | Temp 97.9°F | Ht 67.0 in | Wt 156.8 lb

## 2023-03-06 DIAGNOSIS — Z7984 Long term (current) use of oral hypoglycemic drugs: Secondary | ICD-10-CM | POA: Diagnosis not present

## 2023-03-06 DIAGNOSIS — E1169 Type 2 diabetes mellitus with other specified complication: Secondary | ICD-10-CM | POA: Diagnosis not present

## 2023-03-06 DIAGNOSIS — Z716 Tobacco abuse counseling: Secondary | ICD-10-CM

## 2023-03-06 DIAGNOSIS — I10 Essential (primary) hypertension: Secondary | ICD-10-CM | POA: Diagnosis not present

## 2023-03-06 LAB — GLUCOSE, POCT (MANUAL RESULT ENTRY): POC Glucose: 302 mg/dL — AB (ref 70–99)

## 2023-03-06 MED ORDER — NICOTINE 7 MG/24HR TD PT24
7.0000 mg | MEDICATED_PATCH | Freq: Every day | TRANSDERMAL | 0 refills | Status: DC
  Filled 2023-03-06: qty 28, 28d supply, fill #0

## 2023-03-06 MED ORDER — TRULICITY 0.75 MG/0.5ML ~~LOC~~ SOAJ: 0.7500 mg | SUBCUTANEOUS | 2 refills | Status: DC

## 2023-03-06 MED ORDER — TRULICITY 0.75 MG/0.5ML ~~LOC~~ SOAJ
0.7500 mg | SUBCUTANEOUS | 2 refills | Status: DC
  Filled 2023-03-06: qty 2, 28d supply, fill #0

## 2023-03-06 NOTE — Patient Instructions (Addendum)
Please check your blood pressure and pulse daily and send [place] the readings in 1 week. Please check your blood sugars before you eat and also send [place] the readings in 1 week. Trulicity has been sent to the pharmacy at [facility]. Center, [address], [place], [place] [address] Hours: Closes 6?PM closed on Saturday and Sunday. Phone: [phone]

## 2023-03-06 NOTE — Progress Notes (Signed)
Established Patient Office Visit  Subjective:  Patient ID: Barbara Larsen, female    DOB: [birth year]  Age: 63 y.o. MRN: [id number]  CC:  Chief Complaint  Patient presents with   Medical Management of Chronic Issues    HPI  Barbara Larsen  with a history of hypertension and diabetes, has been monitoring her blood pressure at home, which was reported to be under 125 systolic, though the diastolic reading was not recalled. She was taking metoprolol, but discontinued it after one dose due to the onset of nightmares, which she attributed to the medication. She has  been taking metformin, atorvastatin, and lisinopril.  The patient's blood glucose was checked once this week, with a reading of 123 postprandial.  HPI   Past Medical History:  Diagnosis Date   Diabetes mellitus without complication (HCC)    Hypertension     Past Surgical History:  Procedure Laterality Date   COLONOSCOPY WITH PROPOFOL N/A 11/14/2020   Procedure: COLONOSCOPY WITH PROPOFOL;  Surgeon: Toney Reil, MD;  Location: [facility];  Service: Gastroenterology;  Laterality: N/A;    Family History  Problem Relation Age of Onset   Breast cancer Neg Hx     Social History   Socioeconomic History   Marital status: Divorced    Spouse name: Not on file   Number of children: Not on file   Years of education: Not on file   Highest education level: Not on file  Occupational History   Not on file  Tobacco Use   Smoking status: Some Days    Types: Cigarettes   Smokeless tobacco: Never   Tobacco comments:    Less than 1 pack of day 03/06/23   Substance and Sexual Activity   Alcohol use: Not Currently    Comment: occasionaly   Drug use: Never   Sexual activity: Not Currently  Other Topics Concern   Not on file  Social History Narrative   Not on file   Social Drivers of Health   Financial Resource Strain: Not on file  Food Insecurity: Not on file  Transportation Needs: Not on file  Physical  Activity: Not on file  Stress: Not on file  Social Connections: Not on file  Intimate Partner Violence: Not on file     Outpatient Medications Prior to Visit  Medication Sig Dispense Refill   atorvastatin (LIPITOR) 40 MG tablet Take 40 mg by mouth daily.     Blood Glucose Monitoring Suppl DEVI 1 each by Does not apply route in the morning, at noon, and at bedtime. May substitute to any manufacturer covered by patient's insurance. 1 each 0   lisinopril (ZESTRIL) 10 MG tablet Take 1 tablet (10 mg total) by mouth daily. 90 tablet 3   metFORMIN (GLUCOPHAGE) 1000 MG tablet Take 1,000 mg by mouth 2 (two) times daily.     metoprolol succinate (TOPROL-XL) 25 MG 24 hr tablet Take 1 tablet (25 mg total) by mouth daily. 90 tablet 3   valsartan (DIOVAN) 40 MG tablet      No facility-administered medications prior to visit.    Allergies  Allergen Reactions   Codeine Other (See Comments)    Too strong. Patient felt off    ROS Review of Systems Negative unless indicated in HPI.    Objective:    Physical Exam Constitutional:      Appearance: Normal appearance.  HENT:     Mouth/Throat:     Mouth: Mucous membranes are moist.  Eyes:  Conjunctiva/sclera: Conjunctivae normal.     Pupils: Pupils are equal, round, and reactive to light.  Cardiovascular:     Rate and Rhythm: Normal rate and regular rhythm.     Pulses: Normal pulses.     Heart sounds: Normal heart sounds.  Pulmonary:     Effort: Pulmonary effort is normal.     Breath sounds: Normal breath sounds.  Musculoskeletal:     Cervical back: Normal range of motion. No tenderness.  Skin:    General: Skin is warm.     Findings: No bruising.  Neurological:     General: No focal deficit present.     Mental Status: She is alert and oriented to person, place, and time. Mental status is at baseline.  Psychiatric:        Mood and Affect: Mood normal.        Behavior: Behavior normal.        Thought Content: Thought content  normal.        Judgment: Judgment normal.            BP 122/80   Pulse (!) 107   Temp 97.9 F (36.6 C)   Ht 5\' 7"  (1.702 m)   Wt 156 lb 12.8 oz (71.1 kg)   SpO2 99%   BMI 24.56 kg/m  Wt Readings from Last 3 Encounters:  03/06/23 156 lb 12.8 oz (71.1 kg)  02/06/23 154 lb 3.2 oz (69.9 kg)  02/03/23 160 lb (72.6 kg)     Health Maintenance  Topic Date Due   Pneumococcal Vaccine 71-20 Years old (1 of 2 - PCV) Never done   FOOT EXAM  Never done   HIV Screening  Never done   Hepatitis C Screening  Never done   DTaP/Tdap/Td (1 - Tdap) Never done   Cervical Cancer Screening (HPV/Pap Cotest)  Never done   Lung Cancer Screening  Never done   Zoster Vaccines- Shingrix (1 of 2) Never done   OPHTHALMOLOGY EXAM  05/18/2022   COVID-19 Vaccine (1 - 2024-25 season) 03/20/2023 (Originally 10/19/2022)   INFLUENZA VACCINE  05/18/2023 (Originally 09/18/2022)   HEMOGLOBIN A1C  05/14/2023   Diabetic kidney evaluation - Urine ACR  05/29/2023   Diabetic kidney evaluation - eGFR measurement  02/06/2024   MAMMOGRAM  04/29/2024   Colonoscopy  11/14/2025   HPV VACCINES  Aged Out    There are no preventive care reminders to display for this patient.  Lab Results  Component Value Date   TSH 0.79 02/06/2023   Lab Results  Component Value Date   WBC 5.6 02/03/2023   HGB 13.6 02/03/2023   HCT 38.7 02/03/2023   MCV 77.1 (L) 02/03/2023   PLT 300 02/03/2023   Lab Results  Component Value Date   NA 135 02/06/2023   K 4.5 02/06/2023   CO2 27 02/06/2023   GLUCOSE 424 (H) 02/06/2023   BUN 13 02/06/2023   CREATININE 0.92 02/06/2023   BILITOT 0.5 02/06/2023   ALKPHOS 111 02/06/2023   AST 11 02/06/2023   ALT 11 02/06/2023   PROT 7.1 02/06/2023   ALBUMIN 4.3 02/06/2023   CALCIUM 9.3 02/06/2023   ANIONGAP 11 02/03/2023   EGFR 70 04/19/2021   GFR 65.67 02/06/2023   Lab Results  Component Value Date   CHOL 96 02/06/2023   Lab Results  Component Value Date   HDL 29.70 (L) 02/06/2023    Lab Results  Component Value Date   LDLCALC 39 02/06/2023   Lab Results  Component  Value Date   TRIG 137.0 02/06/2023   Lab Results  Component Value Date   CHOLHDL 3 02/06/2023   Lab Results  Component Value Date   HGBA1C 14.5 (H) 11/14/2022      Assessment & Plan:  Type 2 diabetes mellitus with other specified complication, without long-term current use of insulin (HCC) Assessment & Plan: Patient reports blood glucose reading of 123 postprandial. Currently on Metformin and Atorvastatin. Patient has not yet started Ozempic. -Start Trulicity, to be picked up at [facility] pharmacy. -Advise patient to check fasting blood glucose levels.   Orders: -     Trulicity; Inject 0.75 mg into the skin once a week.  Dispense: 6 mL; Refill: 2 -     POCT glucose (manual entry)  Primary hypertension Assessment & Plan: Blood pressure readings at home reported to be under 125 systolic, but exact diastolic reading not known. Patient has been taking Lisinopril 10mg  daily. Today's office reading is 122/80. -Increase Lisinopril to 20mg  daily (two tablets of current 10mg  prescription). - Discontinued metoprolol, pt declined.  -Advise patient to monitor blood pressure and pulse at home and record readings for one week.   Tobacco abuse counseling Assessment & Plan: Patient reports smoking slightly more than half a pack per day for over 20 years. Expressed desire to quit. -started Nicotine patch.       Other orders -     Nicotine; Place 1 patch (7 mg total) onto the skin daily.  Dispense: 28 patch; Refill: 0    Follow-up: Return in about 1 month (around 04/06/2023).   Kara Dies, NP

## 2023-03-09 ENCOUNTER — Other Ambulatory Visit: Payer: Medicaid Other

## 2023-03-09 ENCOUNTER — Encounter: Payer: Self-pay | Admitting: Pharmacist

## 2023-03-09 NOTE — Progress Notes (Deleted)
Future Appointments  Date Time Provider Department Center  03/09/2023 10:00 AM [facility] PHARMACIST [facility]  03/20/2023 11:00 AM Debbe Odea, MD [facility] None  04/03/2023 11:20 AM Kara Dies, NP [facility]

## 2023-03-09 NOTE — Progress Notes (Signed)
Attempted to contact patient for scheduled appointment for medication management. Left HIPAA compliant message for patient to return my call at their convenience.   Called patient to review new medication Trulicity to ensure she was able to get this from the pharmacy and that she has no concerns with side effects/injection device.   No answer. No voicemail set up.

## 2023-03-16 ENCOUNTER — Telehealth: Payer: Self-pay | Admitting: Nurse Practitioner

## 2023-03-16 DIAGNOSIS — Z716 Tobacco abuse counseling: Secondary | ICD-10-CM | POA: Insufficient documentation

## 2023-03-16 NOTE — Assessment & Plan Note (Signed)
Patient reports blood glucose reading of 123 postprandial. Currently on Metformin and Atorvastatin. Patient has not yet started Ozempic. -Start Trulicity, to be picked up at [facility] pharmacy. -Advise patient to check fasting blood glucose levels.

## 2023-03-16 NOTE — Assessment & Plan Note (Signed)
Patient reports smoking slightly more than half a pack per day for over 20 years. Expressed desire to quit. -started Nicotine patch.

## 2023-03-16 NOTE — Telephone Encounter (Signed)
Patient states she will call back with home BP reading, pulse and blood sugar reading.

## 2023-03-16 NOTE — Assessment & Plan Note (Signed)
Blood pressure readings at home reported to be under 125 systolic, but exact diastolic reading not known. Patient has been taking Lisinopril 10mg  daily. Today's office reading is 122/80. -Increase Lisinopril to 20mg  daily (two tablets of current 10mg  prescription). - Discontinued metoprolol, pt declined.  -Advise patient to monitor blood pressure and pulse at home and record readings for one week.

## 2023-03-16 NOTE — Telephone Encounter (Signed)
Patient states her BP cuff broke so she is on the way to get a new one and she will check her BP, pulse and fasting blood sugar in the morning and call us with the results.

## 2023-03-20 ENCOUNTER — Other Ambulatory Visit: Payer: Self-pay

## 2023-03-20 ENCOUNTER — Ambulatory Visit: Payer: Medicaid Other | Admitting: Cardiology

## 2023-04-03 ENCOUNTER — Ambulatory Visit: Payer: Medicaid Other | Admitting: Nurse Practitioner

## 2023-04-09 ENCOUNTER — Other Ambulatory Visit: Payer: Self-pay | Admitting: Nurse Practitioner

## 2023-04-09 ENCOUNTER — Ambulatory Visit: Payer: Medicaid Other | Admitting: Nurse Practitioner

## 2023-04-09 NOTE — Telephone Encounter (Signed)
Copied from CRM [id number]. Topic: Clinical - Medication Refill >> Apr 09, 2023 12:13 PM Florestine Avers wrote: Most Recent Primary Care Visit:  Provider: Kara Dies  Department: [facility]  Visit Type: OFFICE VISIT  Date: 03/06/2023  Medication: Freestyle Libre or any continuous glucose monitoring device that is covered by the patients insurance.  Has the patient contacted their pharmacy? No (Agent: If no, request that the patient contact the pharmacy for the refill. If patient does not wish to contact the pharmacy document the reason why and proceed with request.) (Agent: If yes, when and what did the pharmacy advise?)  Is this the correct pharmacy for this prescription? Yes If no, delete pharmacy and type the correct one.  This is the patient's preferred pharmacy:  [facility] [address] [place] - [address] [place] [address] Phone: [phone] Fax: [phone]  [facility] [address] [place] [address] Phone: [phone] Fax: [phone]   Has the prescription been filled recently? No  Is the patient out of the medication? Yes  Has the patient been seen for an appointment in the last year OR does the patient have an upcoming appointment? Yes  Can we respond through MyChart? Yes  Agent: Please be advised that Rx refills may take up to 3 business days. We ask that you follow-up with your pharmacy.

## 2023-04-10 ENCOUNTER — Ambulatory Visit: Payer: Medicaid Other | Admitting: Nurse Practitioner

## 2023-04-10 ENCOUNTER — Telehealth: Payer: Self-pay

## 2023-04-10 ENCOUNTER — Other Ambulatory Visit: Payer: Self-pay

## 2023-04-10 MED ORDER — FREESTYLE LIBRE 3 SENSOR MISC: 3 refills | Status: AC

## 2023-04-10 NOTE — Telephone Encounter (Signed)
Please send the prescription for CGM

## 2023-04-10 NOTE — Telephone Encounter (Signed)
Left message to call the office back and reschedule her appointment that she missed this morning.

## 2023-04-23 ENCOUNTER — Ambulatory Visit: Payer: Medicaid Other | Admitting: Nurse Practitioner

## 2023-04-28 ENCOUNTER — Other Ambulatory Visit: Payer: Medicaid Other

## 2023-04-28 NOTE — Progress Notes (Deleted)
 04/28/2023 Name: PATI THINNES MRN: [id number] DOB: Oct 17, 1960  Subjective  No chief complaint on file.  Reason for visit: ?  SYLWIA CUERVO is a 63 y.o. female with a history of diabetes (type 2), who presents today for an initial diabetes pharmacotherapy visit.? Pertinent PMH also includes HTN, HLD.  Known DM Complications: no known complications   Care Team: Primary Care Provider: Kara Dies, NP   Date of Last Diabetes Related Visit: with PCP on 02/06/23 and with Pharmacist on 03/06/23   Recent Summary of Change: 1/13: Trulicity PA submitted. 1/17 Trulicity prescribed/filled at pharmacy.  12/20 PCP Diabetes: A1c 14.5, she is not taking ozempic only taking metformin. She does not check blood sugar at home. Advised patient to start back on Ozempic and metformin.  HTN: Called pharmacy and verified that the prescription for metoprolol and lisinopril is ready for pickup advised patient to pick up the medication and start taking it continuously. RTC 2 weeks - 03/06/23  Medication Access/Adherence: Prescription drug coverage: Payor: [place] MEDICAID PREPAID HEALTH PLAN / Plan: [place] MEDICAID HEALTHY BLUE / Product Type: *No Product type* / . Medicare HealthBlue     - Ozempic PA Denied 11/2022 due to requirement to fail 3 preferred agents ( Key: [facility] ); Now has Medicaid, should be covered. - Reports that current medications are affordable.  - Current Patient Assistance: None (Medicaid, non-eligible) - Medication Adherence: Patient reports missing doses of their medication, primarily evening doses. Feels this has improved since last PCP visit.     Since Last visit / History of Present Illness: ?  Patient denies implementing plan from last visit. Reports that she was able to get Trulicity from the pharmacy, though read side effects online related to hallucinations so she decided not to take it. Reports she is not interested in medications other than insulin at this time.   Has not  refilled metformin in ~2 months. ***  Reported DM Regimen: ?   (never started)  - Last filled 1/24 30ds   DM medications tried in the past:?  Januvia (Pt reported Dizziness - today patient denies ever taking this) Semaglutide (sample from clinic caused skin reaction -- red know). Has never been filled at the pharmacy. 8   SMBG: Not checking ?  Confirms she recently picked up all supplies needed for her glucometer (test strips, lancets, etc.). Denies need of any prescription refills or missing components. States she will start checking this week.    Hypo/Hyperglycemia: ?  Symptoms of hypoglycemia since last visit:? no  If yes, it was treated by: n/a  Symptoms of hyperglycemia since last visit:? no - Reports that when her sugars are "very elevated" she experiences increased urination. Not an issue currently, urinates 0 times over night.   DM Prevention:  Statin: Taking; high intensity Poor fill history  History of chronic kidney disease? no History of albuminuria? no, last UACR on 05/29/22 = 3.3 mg/g ACE/ARB - Taking lisinopril; Urine MA/CR Ratio - normal.  Last eye exam: 05/17/21; No retinopathy present - DUE Last foot exam: No foot exam found Tobacco Use: Occasional smoker  Immunizations:? No record of immunizations  Cardiovascular Risk Reduction History of clinical ASCVD? no History of heart failure? no History of hyperlipidemia? yes Current BMI: 24.1 kg/m2 (Ht 67 in, Wt 69.9 kg) Taking statin? yes Taking aspirin? not indicated; Not taking   Taking SGLT-2i? no Taking GLP- 1 RA? no   Hypertension Reported HTN Regimen: ?  lisinopril 10 mg daily (night) - Last  filled 03/11/23 30ds Metoprolol succinate 25 mg daily - Filled 02/06/23 x30 ds -- Not taking due to "having crazy thoughts.. messed with my mind"   HTN medications tried in the past:?  Lisinopril-hydrochlorothiazide (Patient cannot recall taking. Fill hx shows taking regularly 03/2020 - 07/2021. Prescribed previously by  [facility] PCP Shane Crutch, PA)  Patient takes their blood pressure medications in the evening Patient is not checking their blood pressure at home regularly. Reports she recently obtained a BP cuff and her daughter is going to help her set it up this afternoon.  Patient denies hypotensive s/sx. No dizziness, lightheadedness.  Patient denies hypertensive symptoms. No headache, chest pain, shortness of breath, visual changes.      _______________________________________________  Objective    Review of Systems:? Limited in the setting of virtual visit  Constitutional:? No fever, chills or unintentional weight loss  Cardiovascular:? No chest pain or pressure, shortness of breath, dyspnea on exertion, orthopnea or LE edema  Pulmonary:? No cough or shortness of breath  GI:? constipation, ; No nausea, vomiting, diarrhea, abdominal pain, dyspepsia, change in bowel habits  Endocrine:? No polyuria, polyphagia or blurred vision    Physical Examination:  Vitals:  Wt Readings from Last 3 Encounters:  03/06/23 156 lb 12.8 oz (71.1 kg)  02/06/23 154 lb 3.2 oz (69.9 kg)  02/03/23 160 lb (72.6 kg)   BP Readings from Last 3 Encounters:  03/06/23 122/80  02/06/23 (!) 150/88  02/03/23 (!) 167/88   Pulse Readings from Last 3 Encounters:  03/06/23 (!) 107  02/06/23 (!) 114  02/03/23 (!) 101     Labs:?  Lab Results  Component Value Date   HGBA1C 14.5 (H) 11/14/2022   HGBA1C 11.6 (H) 05/29/2022   HGBA1C 13.0 10/31/2021   GLUCOSE 424 (H) 02/06/2023   MICRALBCREAT 3.3 05/29/2022   CREATININE 0.92 02/06/2023   CREATININE 0.97 02/03/2023   CREATININE 0.84 11/14/2022   GFR 65.67 02/06/2023   GFR 73.36 11/14/2022   GFR 59.00 (L) 05/29/2022    Lab Results  Component Value Date   CHOL 96 02/06/2023   LDLCALC 39 02/06/2023   LDLCALC 52 11/14/2022   LDLCALC 50 05/29/2022   LDLDIRECT 57.0 05/29/2022   HDL 29.70 (L) 02/06/2023   TRIG 137.0 02/06/2023   TRIG 124.0 11/14/2022   TRIG 118.0  05/29/2022   ALT 11 02/06/2023   ALT 16 02/03/2023   AST 11 02/06/2023   AST 17 02/03/2023      Chemistry      Component Value Date/Time   NA 135 02/06/2023 1119   K 4.5 02/06/2023 1119   CL 101 02/06/2023 1119   CO2 27 02/06/2023 1119   BUN 13 02/06/2023 1119   CREATININE 0.92 02/06/2023 1119   CREATININE 0.92 04/19/2021 1225      Component Value Date/Time   CALCIUM 9.3 02/06/2023 1119   ALKPHOS 111 02/06/2023 1119   AST 11 02/06/2023 1119   ALT 11 02/06/2023 1119   BILITOT 0.5 02/06/2023 1119       The ASCVD Risk score (Arnett DK, et al., 2019) failed to calculate for the following reasons:   The valid total cholesterol range is 130 to 320 mg/dL  Assessment and Plan:   1. Diabetes, type 2: uncontrolled per last A1c of 14.5% (11/14/22), increased from previous 11.6% (05/29/22) with goal <7% without hypoglycemia. Overdue for repeat, has missed PCP follow up visit.  Hyperglycemia 2/2 not taking prescribed medication. Adherence barriers identified: Forgets doses, especially night doses due to falling  asleep. Feels this has improved.  Consider 90 day supply of chronic medications Consider adherence packaging Glucometer: Not checking. Confirms recently picked up all supplies needed. Will start checking.  Current Regimen: metformin 1000 mg BID w meals Future Consideration: GLP1-RA: Reports "knot" at injection site w Ozempic. PA for Ozempic denied in October.  Consider alternative GLP1: Trulicity PA request sent to PA team.  Future: Mounjaro non-preferred though covered if unable to tolerate/take 2 preferred GLP1s (Ozempic, Trulicity) SGLT2i: Reasonable in the future though defer to when A1c closer to goal due to risk volume depletion/dehydration.  SU: Not unreasonable at this time given significant hyperglycemia. Medicaid preferred: Glipizide IR/ER tablet Glipizide-metformin combination tablet TZD: Not unreasonable. Defer given possible weight gain/increase in fracture risk  with more potent options at this time. Insulin: Not unreasonable, defer as last resort given risk hypoglycemia and historically poor compliance. Other safer options at this time.   2. HTN: uncontrolled based on last clinic BP of 150/88 mmHg (02/06/23) 2/2 non-compliance to medications, goal <130/80 mmHg. Does not monitor BP at home though recently obtained a BP cuff. Denies lightheadedness, dizziness, SOB, CP, vision changes. Confirms resuming metoprolol and lisinopril as instructed at last PCP visit though since stopped metoprolol due to perceived adverse effects of change in thoughts.  Current Regimen: lisinopril 10 mg daily Continue medications. Discuss at PCP f/u 03/06/23 Future Consideration: - Lisinopril: Room for optimization of current dose. K and eGFR remain WNL.  - CCB: amlodipine reasonable addition given minimal effect on renal/lytes.  - hydrochlorothiazide: Cl and Na historically lower end of normal to slightly low. Not unreasonable, though may req closer monitoring d/t possible increase risk of electrolyte abnormality.  - Beta blocker: Consider if intolerant to (or in addition to) first line therapies above   3. ASCVD (primary prevention): LDL at goal on last lipid panel with LDL 39 mg/dL, TG 191 mg/dL (47/82/95). LDL goal <70 mg/dL (primary prevention, diabetes).  Key risk factors include: diabetes, hypertension, hyperlipidemia (well-controlled), and sedentary lifestyle Current Regimen: Atorvastatin 40 mg daily (poor compliance per refill history though LDL well-controlled) Continue medications today without changes.    Follow Up Follow up with PCP scheduled this week. Hopeful PA for Trulicity will be approved by that time. Follow up with Pharm ~1 mo after PCP for DM med titration   Future Appointments  Date Time Provider Department Center  04/28/2023  9:30 AM [facility] PHARMACIST [facility]  05/01/2023  8:00 AM Kara Dies, NP [facility]  06/05/2023  9:20 AM  Debbe Odea, MD [facility] None    Loree Fee, PharmD Clinical Pharmacist [facility] [phone]

## 2023-05-01 ENCOUNTER — Ambulatory Visit: Admitting: Nurse Practitioner

## 2023-05-11 ENCOUNTER — Encounter: Payer: Self-pay | Admitting: Pharmacist

## 2023-05-11 NOTE — Progress Notes (Signed)
 Your Welcome : )

## 2023-05-11 NOTE — Progress Notes (Signed)
 Patient stated she did not have the money to come but now she has Insurance and is scheduled for 05/15/23 at 1:40.

## 2023-05-11 NOTE — Progress Notes (Signed)
 Chart Review:  Date of review: 05/11/23 Patient was identified as falling into the [facility] for Diabetes control.   Last A1c: 14.5% (11/14/22) Next A1c Due: 02/12/23 Next Scheduled Visit: No show x2  Patient has not been contacted at this time.  Fwd to clinical pool to reschedule PCP visit.   Follow Up:   Future Appointments  Date Time Provider Department Center  06/05/2023  9:20 AM Debbe Odea, MD [facility] None

## 2023-05-15 ENCOUNTER — Ambulatory Visit (INDEPENDENT_AMBULATORY_CARE_PROVIDER_SITE_OTHER): Admitting: Nurse Practitioner

## 2023-05-15 ENCOUNTER — Encounter: Payer: Self-pay | Admitting: Nurse Practitioner

## 2023-05-15 VITALS — BP 138/86 | HR 96 | Temp 97.5°F | Ht 67.0 in | Wt 156.2 lb

## 2023-05-15 DIAGNOSIS — E1169 Type 2 diabetes mellitus with other specified complication: Secondary | ICD-10-CM | POA: Diagnosis not present

## 2023-05-15 DIAGNOSIS — E782 Mixed hyperlipidemia: Secondary | ICD-10-CM | POA: Diagnosis not present

## 2023-05-15 DIAGNOSIS — Z716 Tobacco abuse counseling: Secondary | ICD-10-CM

## 2023-05-15 DIAGNOSIS — Z7984 Long term (current) use of oral hypoglycemic drugs: Secondary | ICD-10-CM | POA: Diagnosis not present

## 2023-05-15 DIAGNOSIS — I10 Essential (primary) hypertension: Secondary | ICD-10-CM

## 2023-05-15 MED ORDER — NICOTINE 7 MG/24HR TD PT24: 7.0000 mg | MEDICATED_PATCH | Freq: Every day | TRANSDERMAL | 0 refills | Status: DC

## 2023-05-15 MED ORDER — ATORVASTATIN CALCIUM 40 MG PO TABS: 40.0000 mg | ORAL_TABLET | Freq: Every day | ORAL | 2 refills | Status: AC

## 2023-05-15 MED ORDER — METFORMIN HCL 1000 MG PO TABS: 1000.0000 mg | ORAL_TABLET | Freq: Two times a day (BID) | ORAL | 1 refills | Status: DC

## 2023-05-15 NOTE — Progress Notes (Signed)
 Established Patient Office Visit  Subjective:  Patient ID: Barbara Larsen, female    DOB: [birth year]  Age: 63 y.o. MRN: [id number]  CC:  Chief Complaint  Patient presents with   Medical Management of Chronic Issues  Discussed the use of a AI scribe software for clinical note transcription with the patient, who gave verbal consent to proceed.  HPI Barbara Larsen is a 63 year old female with diabetes and hypertension who presents for follow-up of high blood sugar levels.  She has been experiencing elevated blood sugar levels, with a recent hemoglobin A1c of 14.5% six months ago. She is currently taking metformin 1000 mg twice daily but has not been using her prescribed Trulicity as she misplaced it. Additionally, she discontinued Ozempic due to a knot forming at the injection site. Her blood sugar was 220 mg/dL in the morning recently, which she attributes to dietary choices the night before.  She has a history of hypertension and is currently taking lisinopril 10 mg daily. She mentions that her blood pressure has been improving with her current medication regimen. She also takes atorvastatin for cholesterol management.  She has been smoking for about 40 years, currently smoking a pack a day, and wants to quit. She is interested in using a patch to aid in smoking cessation.  HPI   Past Medical History:  Diagnosis Date   Diabetes mellitus without complication (HCC)    Hypertension     Past Surgical History:  Procedure Laterality Date   COLONOSCOPY WITH PROPOFOL N/A 11/14/2020   Procedure: COLONOSCOPY WITH PROPOFOL;  Surgeon: Selena Daily, MD;  Location: [facility] ENDOSCOPY;  Service: Gastroenterology;  Laterality: N/A;    Family History  Problem Relation Age of Onset   Breast cancer Neg Hx     Social History   Socioeconomic History   Marital status: Divorced    Spouse name: Not on file   Number of children: Not on file   Years of education: Not on file   Highest  education level: Not on file  Occupational History   Not on file  Tobacco Use   Smoking status: Every Day    Current packs/day: 1.00    Average packs/day: 1 pack/day for 40.3 years (40.3 ttl pk-yrs)    Types: Cigarettes    Start date: 70   Smokeless tobacco: Never   Tobacco comments:    1 pack a day as of 05/15/23  Substance and Sexual Activity   Alcohol use: Not Currently    Comment: occasionaly   Drug use: Never   Sexual activity: Not Currently  Other Topics Concern   Not on file  Social History Narrative   Not on file   Social Drivers of Health   Financial Resource Strain: Not on file  Food Insecurity: Not on file  Transportation Needs: Not on file  Physical Activity: Not on file  Stress: Not on file  Social Connections: Not on file  Intimate Partner Violence: Not on file     Outpatient Medications Prior to Visit  Medication Sig Dispense Refill   Blood Glucose Monitoring Suppl DEVI 1 each by Does not apply route in the morning, at noon, and at bedtime. May substitute to any manufacturer covered by patient's insurance. 1 each 0   Continuous Glucose Sensor (FREESTYLE LIBRE 2 PLUS SENSOR) MISC by Does not apply route.     Continuous Glucose Sensor (FREESTYLE LIBRE 3 SENSOR) MISC Place 1 sensor on the skin every 14 days. Use to  check glucose continuously 2 each 3   Dulaglutide (TRULICITY) 0.75 MG/0.5ML SOAJ Inject 0.75 mg into the skin once a week. 6 mL 2   atorvastatin (LIPITOR) 40 MG tablet Take 40 mg by mouth daily.     lisinopril (ZESTRIL) 10 MG tablet Take 1 tablet (10 mg total) by mouth daily. 90 tablet 3   metFORMIN (GLUCOPHAGE) 1000 MG tablet Take 1,000 mg by mouth 2 (two) times daily.     valsartan (DIOVAN) 40 MG tablet  (Patient not taking: Reported on 05/15/2023)     metoprolol succinate (TOPROL-XL) 25 MG 24 hr tablet Take 1 tablet (25 mg total) by mouth daily. (Patient not taking: Reported on 05/15/2023) 90 tablet 3   nicotine (NICODERM CQ - DOSED IN MG/24 HR) 7  mg/24hr patch Place 1 patch (7 mg total) onto the skin daily. (Patient not taking: Reported on 05/15/2023) 28 patch 0   No facility-administered medications prior to visit.    Allergies  Allergen Reactions   Codeine Other (See Comments)    Too strong. Patient felt off    ROS Review of Systems Negative unless indicated in HPI.    Objective:    Physical Exam Constitutional:      Appearance: Normal appearance.  HENT:     Mouth/Throat:     Mouth: Mucous membranes are moist.  Eyes:     Conjunctiva/sclera: Conjunctivae normal.     Pupils: Pupils are equal, round, and reactive to light.  Cardiovascular:     Rate and Rhythm: Normal rate and regular rhythm.     Pulses: Normal pulses.     Heart sounds: Normal heart sounds.  Pulmonary:     Effort: Pulmonary effort is normal.     Breath sounds: Normal breath sounds.  Abdominal:     General: Bowel sounds are normal.     Palpations: Abdomen is soft.  Musculoskeletal:     Cervical back: Normal range of motion. No tenderness.  Skin:    General: Skin is warm.     Findings: No bruising.  Neurological:     General: No focal deficit present.     Mental Status: She is alert and oriented to person, place, and time. Mental status is at baseline.  Psychiatric:        Mood and Affect: Mood normal.        Behavior: Behavior normal.        Thought Content: Thought content normal.        Judgment: Judgment normal.     BP 138/86   Pulse 96   Temp (!) 97.5 F (36.4 C)   Ht 5\' 7"  (1.702 m)   Wt 156 lb 3.2 oz (70.9 kg)   SpO2 98%   BMI 24.46 kg/m  Wt Readings from Last 3 Encounters:  05/15/23 156 lb 3.2 oz (70.9 kg)  03/06/23 156 lb 12.8 oz (71.1 kg)  02/06/23 154 lb 3.2 oz (69.9 kg)     Health Maintenance  Topic Date Due   Medicare Annual Wellness (AWV)  Never done   FOOT EXAM  Never done   HIV Screening  Never done   Hepatitis C Screening  Never done   DTaP/Tdap/Td (1 - Tdap) Never done   Pneumonia Vaccine 53+ Years old  (1 of 2 - PCV) Never done   Cervical Cancer Screening (HPV/Pap Cotest)  Never done   Lung Cancer Screening  Never done   Zoster Vaccines- Shingrix (1 of 2) Never done   OPHTHALMOLOGY EXAM  05/18/2022  COVID-19 Vaccine (1 - 2024-25 season) Never done   DEXA SCAN  Never done   Diabetic kidney evaluation - Urine ACR  05/29/2023   INFLUENZA VACCINE  09/18/2023   HEMOGLOBIN A1C  11/15/2023   MAMMOGRAM  04/29/2024   Diabetic kidney evaluation - eGFR measurement  05/14/2024   Colonoscopy  11/14/2025   HPV VACCINES  Aged Out   Meningococcal B Vaccine  Aged Out    There are no preventive care reminders to display for this patient.  Lab Results  Component Value Date   TSH 0.99 05/15/2023   Lab Results  Component Value Date   WBC 5.5 05/15/2023   HGB 13.2 05/15/2023   HCT 39.8 05/15/2023   MCV 82.2 05/15/2023   PLT 272 05/15/2023   Lab Results  Component Value Date   NA 137 05/15/2023   K 4.5 05/15/2023   CO2 27 05/15/2023   GLUCOSE 277 (H) 05/15/2023   BUN 13 05/15/2023   CREATININE 0.87 05/15/2023   BILITOT 0.5 05/15/2023   ALKPHOS 111 02/06/2023   AST 15 05/15/2023   ALT 13 05/15/2023   PROT 6.3 05/15/2023   ALBUMIN 4.3 02/06/2023   CALCIUM 9.3 05/15/2023   ANIONGAP 11 02/03/2023   EGFR 74 05/15/2023   GFR 65.67 02/06/2023   Lab Results  Component Value Date   CHOL 111 05/15/2023   Lab Results  Component Value Date   HDL 33 (L) 05/15/2023   Lab Results  Component Value Date   LDLCALC 53 05/15/2023   Lab Results  Component Value Date   TRIG 169 (H) 05/15/2023   Lab Results  Component Value Date   CHOLHDL 3.4 05/15/2023   Lab Results  Component Value Date   HGBA1C 12.3 (H) 05/15/2023      Assessment & Plan:  Type 2 diabetes mellitus with other specified complication, without long-term current use of insulin (HCC) Assessment & Plan: Poorly controlled with A1c of 14.5% and fasting glucose of 220 mg/dL. Non-adherence to Trulicity. Taking metformin.  Previous adverse reaction to Ozempic. - Order A1c test. - Advise dietary modifications, reduce fried foods and alcohol. - Discuss potential insulin therapy if no improvement.  Orders: -     metFORMIN HCl; Take 1 tablet (1,000 mg total) by mouth 2 (two) times daily.  Dispense: 180 tablet; Refill: 1 -     Hemoglobin A1c  Mixed hyperlipidemia Assessment & Plan: Managed with atorvastatin. - Continue atorvastatin. -Will check lipid panel.  Orders: -     Lipid panel -     Atorvastatin Calcium; Take 1 tablet (40 mg total) by mouth daily.  Dispense: 90 tablet; Refill: 2  Primary hypertension Assessment & Plan: Patient BP  Vitals:   05/15/23 1346  BP: 138/86  -Advised pt to follow a low sodium and heart healthy diet. -Advised to increase lisinopril from 10 to 20 mg daily. - Advise smoking cessation.   Orders: -     CBC with Differential/Platelet -     Comprehensive metabolic panel with GFR -     TSH  Tobacco abuse counseling Assessment & Plan: Smokes a pack a day for 40 years. Desires to quit. Smoking cessation crucial. - Prescribe nicotine patches. - Educate on health risks of smoking. - Encourage smoking cessation.  Orders: -     Nicotine; Place 1 patch (7 mg total) onto the skin daily.  Dispense: 28 patch; Refill: 0 -     Ambulatory Referral for Lung Cancer Scre    Follow-up: No follow-ups  on file.   Dezaree Tracey, NP

## 2023-05-16 LAB — LIPID PANEL
Cholesterol: 111 mg/dL (ref <200)
HDL: 33 mg/dL — ABNORMAL LOW (ref >=50)
LDL Cholesterol (Calc): 53 mg/dL
Non-HDL Cholesterol (Calc): 78 mg/dL (ref <130)
Total CHOL/HDL Ratio: 3.4 (calc) (ref <5.0)
Triglycerides: 169 mg/dL — ABNORMAL HIGH (ref <150)

## 2023-05-16 LAB — COMPREHENSIVE METABOLIC PANEL WITH GFR
AG Ratio: 1.9 (calc) (ref 1.0–2.5)
ALT: 13 U/L (ref 6–29)
AST: 15 U/L (ref 10–35)
Albumin: 4.1 g/dL (ref 3.6–5.1)
Alkaline phosphatase (APISO): 92 U/L (ref 37–153)
BUN: 13 mg/dL (ref 7–25)
CO2: 27 mmol/L (ref 20–32)
Calcium: 9.3 mg/dL (ref 8.6–10.4)
Chloride: 102 mmol/L (ref 98–110)
Creat: 0.87 mg/dL (ref 0.50–1.05)
Globulin: 2.2 g/dL (ref 1.9–3.7)
Glucose, Bld: 277 mg/dL — ABNORMAL HIGH (ref 65–99)
Potassium: 4.5 mmol/L (ref 3.5–5.3)
Sodium: 137 mmol/L (ref 135–146)
Total Bilirubin: 0.5 mg/dL (ref 0.2–1.2)
Total Protein: 6.3 g/dL (ref 6.1–8.1)
eGFR: 74 mL/min/{1.73_m2} (ref >=60)

## 2023-05-16 LAB — CBC WITH DIFFERENTIAL/PLATELET
Absolute Lymphocytes: 1975 {cells}/uL (ref 850–3900)
Absolute Monocytes: 363 {cells}/uL (ref 200–950)
Basophils Absolute: 39 {cells}/uL (ref 0–200)
Basophils Relative: 0.7 %
Eosinophils Absolute: 253 {cells}/uL (ref 15–500)
Eosinophils Relative: 4.6 %
HCT: 39.8 % (ref 35.0–45.0)
Hemoglobin: 13.2 g/dL (ref 11.7–15.5)
MCH: 27.3 pg (ref 27.0–33.0)
MCHC: 33.2 g/dL (ref 32.0–36.0)
MCV: 82.2 fL (ref 80.0–100.0)
MPV: 9.1 fL (ref 7.5–12.5)
Monocytes Relative: 6.6 %
Neutro Abs: 2871 {cells}/uL (ref 1500–7800)
Neutrophils Relative %: 52.2 %
Platelets: 272 10*3/uL (ref 140–400)
RBC: 4.84 10*6/uL (ref 3.80–5.10)
RDW: 14.5 % (ref 11.0–15.0)
Total Lymphocyte: 35.9 %
WBC: 5.5 10*3/uL (ref 3.8–10.8)

## 2023-05-16 LAB — TSH: TSH: 0.99 m[IU]/L (ref 0.40–4.50)

## 2023-05-16 LAB — HEMOGLOBIN A1C
Hgb A1c MFr Bld: 12.3 %{Hb} — ABNORMAL HIGH (ref <5.7)
Mean Plasma Glucose: 306 mg/dL
eAG (mmol/L): 17 mmol/L

## 2023-05-26 ENCOUNTER — Telehealth: Payer: Self-pay

## 2023-05-26 DIAGNOSIS — E782 Mixed hyperlipidemia: Secondary | ICD-10-CM

## 2023-05-26 DIAGNOSIS — E1169 Type 2 diabetes mellitus with other specified complication: Secondary | ICD-10-CM

## 2023-05-26 MED ORDER — LISINOPRIL 10 MG PO TABS: 10.0000 mg | ORAL_TABLET | Freq: Every day | ORAL | 0 refills | Status: DC

## 2023-05-26 NOTE — Progress Notes (Signed)
 The blood work shows no sign of anemia, electrolytes normal. Liver and  kidney normal. Elevated triglycerides, would recommend avoid processed food.  A1c dropped from 14.5 to 12.3 but still does not meat the target.  Will recheck Hg A1c in 3 months, please schedule appointment for lab.

## 2023-05-26 NOTE — Telephone Encounter (Signed)
 Labs have been ordered for lab appt per result note message. While on phone with pt she stated that she has misplaced her Lisinopril and needs a new rx sent in to the pharmacy. I advised her that I would send to pharmacy but she may have to pay for it out of pocket since it will be an early refill.

## 2023-05-31 NOTE — Assessment & Plan Note (Signed)
 Poorly controlled with A1c of 14.5% and fasting glucose of 220 mg/dL. Non-adherence to Trulicity. Taking metformin. Previous adverse reaction to Ozempic. - Order A1c test. - Advise dietary modifications, reduce fried foods and alcohol. - Discuss potential insulin therapy if no improvement.

## 2023-05-31 NOTE — Assessment & Plan Note (Addendum)
 Patient BP  Vitals:   05/15/23 1346  BP: 138/86  -Advised pt to follow a low sodium and heart healthy diet. -Advised to increase lisinopril from 10 to 20 mg daily. - Advise smoking cessation.

## 2023-05-31 NOTE — Assessment & Plan Note (Signed)
 Managed with atorvastatin. - Continue atorvastatin. -Will check lipid panel.

## 2023-05-31 NOTE — Assessment & Plan Note (Signed)
 Smokes a pack a day for 40 years. Desires to quit. Smoking cessation crucial. - Prescribe nicotine patches. - Educate on health risks of smoking. - Encourage smoking cessation.

## 2023-06-05 ENCOUNTER — Encounter: Payer: Self-pay | Admitting: Cardiology

## 2023-06-05 ENCOUNTER — Ambulatory Visit: Payer: Medicaid Other | Attending: Cardiology | Admitting: Cardiology

## 2023-06-05 VITALS — BP 120/96 | HR 95

## 2023-06-05 DIAGNOSIS — F17210 Nicotine dependence, cigarettes, uncomplicated: Secondary | ICD-10-CM

## 2023-06-05 DIAGNOSIS — I10 Essential (primary) hypertension: Secondary | ICD-10-CM | POA: Diagnosis not present

## 2023-06-05 DIAGNOSIS — E782 Mixed hyperlipidemia: Secondary | ICD-10-CM | POA: Diagnosis not present

## 2023-06-05 MED ORDER — NICOTINE 14 MG/24HR TD PT24: 14.0000 mg | MEDICATED_PATCH | Freq: Every day | TRANSDERMAL | 0 refills | Status: AC

## 2023-06-05 MED ORDER — NICOTINE 7 MG/24HR TD PT24: 7.0000 mg | MEDICATED_PATCH | Freq: Every day | TRANSDERMAL | 0 refills | Status: AC

## 2023-06-05 MED ORDER — NICOTINE 21 MG/24HR TD PT24: 21.0000 mg | MEDICATED_PATCH | Freq: Every day | TRANSDERMAL | 0 refills | Status: AC

## 2023-06-05 NOTE — Progress Notes (Signed)
 Cardiology Office Note:    Date:  06/05/2023   ID:  Barbara Larsen, DOB 12/10/1960, MRN [id number]  PCP:  Vincente Saber, NP   [facility] Providers Cardiologist:  None     Referring MD: Vincente Saber, NP   Chief Complaint  Patient presents with   New Patient (Initial Visit)    Dr referral for heart evaluation.     History of Present Illness:    Barbara Larsen is a 63 y.o. female with a hx of hypertension, hyperlipidemia, current smoker x 40+ years who presents due to hypertension.  Patient had an appointment with patient had an appointment with primary care physician about 3 months ago, systolic blood pressure noted in the 160s to 180s.  She states eating a pork sandwich earlier which might have caused elevations in BP.  Currently takes lisinopril  10 mg daily for BP control.  Her blood pressures have been relatively well-controlled.  She currently smokes, planning on quitting.  Was prescribed a nicotine  patch but has not been able to pick it up at pharmacy due to unavailability.  She denies chest pain, shortness of breath, dizziness, edema, palpitations.  Feels well, has no concerns at this time.  Past Medical History:  Diagnosis Date   Diabetes mellitus without complication (HCC)    Hypertension     Past Surgical History:  Procedure Laterality Date   COLONOSCOPY WITH PROPOFOL  N/A 11/14/2020   Procedure: COLONOSCOPY WITH PROPOFOL ;  Surgeon: Unk Corinn Skiff, MD;  Location: [facility] ENDOSCOPY;  Service: Gastroenterology;  Laterality: N/A;    Current Medications: Current Meds  Medication Sig   atorvastatin  (LIPITOR) 40 MG tablet Take 1 tablet (40 mg total) by mouth daily.   Blood Glucose Monitoring Suppl DEVI 1 each by Does not apply route in the morning, at noon, and at bedtime. May substitute to any manufacturer covered by patient's insurance.   Continuous Glucose Sensor (FREESTYLE LIBRE 2 PLUS SENSOR) MISC by Does not apply route.   Continuous Glucose  Sensor (FREESTYLE LIBRE 3 SENSOR) MISC Place 1 sensor on the skin every 14 days. Use to check glucose continuously   lisinopril  (ZESTRIL ) 10 MG tablet Take 1 tablet (10 mg total) by mouth daily.   metFORMIN  (GLUCOPHAGE ) 1000 MG tablet Take 1 tablet (1,000 mg total) by mouth 2 (two) times daily.   nicotine  (NICODERM CQ  - DOSED IN MG/24 HOURS) 14 mg/24hr patch Place 1 patch (14 mg total) onto the skin daily for 6 doses.   nicotine  (NICODERM CQ  - DOSED IN MG/24 HOURS) 21 mg/24hr patch Place 1 patch (21 mg total) onto the skin daily for 6 doses.   nicotine  (NICODERM CQ  - DOSED IN MG/24 HR) 7 mg/24hr patch Place 1 patch (7 mg total) onto the skin daily for 2 doses.     Allergies:   Codeine   Social History   Socioeconomic History   Marital status: Divorced    Spouse name: Not on file   Number of children: Not on file   Years of education: Not on file   Highest education level: Not on file  Occupational History   Not on file  Tobacco Use   Smoking status: Every Day    Current packs/day: 1.00    Average packs/day: 1 pack/day for 40.3 years (40.3 ttl pk-yrs)    Types: Cigarettes    Start date: 107   Smokeless tobacco: Never   Tobacco comments:    1 pack a day as of 05/15/23  Substance and Sexual  Activity   Alcohol use: Not Currently    Comment: occasionaly   Drug use: Never   Sexual activity: Not Currently  Other Topics Concern   Not on file  Social History Narrative   Not on file   Social Drivers of Health   Financial Resource Strain: Not on file  Food Insecurity: Not on file  Transportation Needs: Not on file  Physical Activity: Not on file  Stress: Not on file  Social Connections: Not on file     Family History: The patient's family history is negative for Breast cancer.  ROS:   Please see the history of present illness.     All other systems reviewed and are negative.  EKGs/Labs/Other Studies Reviewed:    The following studies were reviewed today:  EKG  Interpretation Date/Time:  Friday June 05 2023 09:32:58 EDT Ventricular Rate:  95 PR Interval:  158 QRS Duration:  78 QT Interval:  364 QTC Calculation: 457 R Axis:   -38  Text Interpretation: Normal sinus rhythm Possible old septal infarct. Confirmed by Darliss Rogue ([id number]) on 06/05/2023 9:44:26 AM    Recent Labs: 05/15/2023: ALT 13; BUN 13; Creat 0.87; Hemoglobin 13.2; Platelets 272; Potassium 4.5; Sodium 137; TSH 0.99  Recent Lipid Panel    Component Value Date/Time   CHOL 111 05/15/2023 1433   TRIG 169 (H) 05/15/2023 1433   HDL 33 (L) 05/15/2023 1433   CHOLHDL 3.4 05/15/2023 1433   VLDL 27.4 02/06/2023 1119   LDLCALC 53 05/15/2023 1433   LDLDIRECT 57.0 05/29/2022 1331     Risk Assessment/Calculations:            Physical Exam:    VS:  BP (!) 120/96 (BP Location: Left Arm, Patient Position: Sitting, Cuff Size: Normal)   Pulse 95   SpO2 99%     Wt Readings from Last 3 Encounters:  05/15/23 156 lb 3.2 oz (70.9 kg)  03/06/23 156 lb 12.8 oz (71.1 kg)  02/06/23 154 lb 3.2 oz (69.9 kg)     GEN:  Well nourished, well developed in no acute distress HEENT: Normal NECK: No JVD; No carotid bruits CARDIAC: RRR, no murmurs, rubs, gallops RESPIRATORY:  Clear to auscultation without rales, wheezing or rhonchi  ABDOMEN: Soft, non-tender, non-distended MUSCULOSKELETAL:  No edema; No deformity  SKIN: Warm and dry NEUROLOGIC:  Alert and oriented x 3 PSYCHIATRIC:  Normal affect   ASSESSMENT:    1. Primary hypertension   2. Smoking greater than 30 pack years   3. Mixed hyperlipidemia    PLAN:    In order of problems listed above:  Hypertension, BP reasonably controlled, diastolic slightly elevated at 96.  Systolic control.  Continue lisinopril  10 mg daily, low-salt diet, smoking cessation emphasized.  Patient is otherwise asymptomatic. Current smoker, 40+ years smoking.  Smoking cessation advised.  Will prescribe nicotine  patch. Hyperlipidemia, continue Lipitor 40  mg daily, start aspirin 81 mg daily.  Hyperlipidemia, continue Lipitor 40 mg daily, start aspirin 81 mg daily.  Follow-up in 3 months.     Medication Adjustments/Labs and Tests Ordered: Current medicines are reviewed at length with the patient today.  Concerns regarding medicines are outlined above.  Orders Placed This Encounter  Procedures   EKG 12-Lead   Meds ordered this encounter  Medications   nicotine  (NICODERM CQ  - DOSED IN MG/24 HOURS) 21 mg/24hr patch    Sig: Place 1 patch (21 mg total) onto the skin daily for 6 doses.    Dispense:  6 patch  Refill:  0   nicotine  (NICODERM CQ  - DOSED IN MG/24 HOURS) 14 mg/24hr patch    Sig: Place 1 patch (14 mg total) onto the skin daily for 6 doses.    Dispense:  6 patch    Refill:  0   nicotine  (NICODERM CQ  - DOSED IN MG/24 HR) 7 mg/24hr patch    Sig: Place 1 patch (7 mg total) onto the skin daily for 2 doses.    Dispense:  2 patch    Refill:  0    Patient Instructions  Medication Instructions:  Your physician recommends the following medication changes.  START TAKING: Nicoderm patch 21 mg patch once daily for 6 days 14 mg patch once daily for 6 days 7 mg patch once daily for 2 days  *If you need a refill on your cardiac medications before your next appointment, please call your pharmacy*  Lab Work: No labs ordered today    Testing/Procedures: No test ordered today   Follow-Up: At [facility], you and your health needs are our priority.  As part of our continuing mission to provide you with exceptional heart care, our providers are all part of one team.  This team includes your primary Cardiologist (physician) and Advanced Practice Providers or APPs (Physician Assistants and Nurse Practitioners) who all work together to provide you with the care you need, when you need it.  Your next appointment:   3 month(s)  Provider:   You may see Dr. Lisabeth or one of the following Advanced Practice Providers on your  designated Care Team:   Lonni Meager, NP Lesley Maffucci, PA-C Bernardino Bring, PA-C Cadence Markham, PA-C Tylene Lunch, NP Barnie Hila, NP    We recommend signing up for the patient portal called MyChart.  Sign up information is provided on this After Visit Summary.  MyChart is used to connect with patients for Virtual Visits (Telemedicine).  Patients are able to view lab/test results, encounter notes, upcoming appointments, etc.  Non-urgent messages can be sent to your provider as well.   To learn more about what you can do with MyChart, go to [email or web address].         Signed, Redell Cave, MD  06/05/2023 3:47 PM    [facility]

## 2023-06-05 NOTE — Patient Instructions (Signed)
 Medication Instructions:  Your physician recommends the following medication changes.  START TAKING: Nicoderm patch 21 mg patch once daily for 6 days 14 mg patch once daily for 6 days 7 mg patch once daily for 2 days  *If you need a refill on your cardiac medications before your next appointment, please call your pharmacy*  Lab Work: No labs ordered today    Testing/Procedures: No test ordered today   Follow-Up: At [facility], you and your health needs are our priority.  As part of our continuing mission to provide you with exceptional heart care, our providers are all part of one team.  This team includes your primary Cardiologist (physician) and Advanced Practice Providers or APPs (Physician Assistants and Nurse Practitioners) who all work together to provide you with the care you need, when you need it.  Your next appointment:   3 month(s)  Provider:   You may see Dr. Lisabeth or one of the following Advanced Practice Providers on your designated Care Team:   Lonni Meager, NP Lesley Maffucci, PA-C Bernardino Bring, PA-C Cadence Mokena, PA-C Tylene Lunch, NP Barnie Hila, NP    We recommend signing up for the patient portal called MyChart.  Sign up information is provided on this After Visit Summary.  MyChart is used to connect with patients for Virtual Visits (Telemedicine).  Patients are able to view lab/test results, encounter notes, upcoming appointments, etc.  Non-urgent messages can be sent to your provider as well.   To learn more about what you can do with MyChart, go to [email or web address].

## 2023-06-19 ENCOUNTER — Ambulatory Visit
Admission: RE | Admit: 2023-06-19 | Discharge: 2023-06-19 | Disposition: A | Discharge status: Home / Self Care | Source: Ambulatory Visit | Attending: Nurse Practitioner | Admitting: Nurse Practitioner

## 2023-06-19 DIAGNOSIS — Z1231 Encounter for screening mammogram for malignant neoplasm of breast: Secondary | ICD-10-CM | POA: Insufficient documentation

## 2023-07-07 ENCOUNTER — Encounter: Payer: Self-pay | Admitting: Nurse Practitioner

## 2023-08-12 ENCOUNTER — Telehealth: Payer: Self-pay | Admitting: Nurse Practitioner

## 2023-08-12 NOTE — Telephone Encounter (Signed)
 Patient need lab orders please

## 2023-08-14 NOTE — Telephone Encounter (Signed)
 Labs are in from May.

## 2023-08-26 ENCOUNTER — Other Ambulatory Visit (INDEPENDENT_AMBULATORY_CARE_PROVIDER_SITE_OTHER)

## 2023-08-26 ENCOUNTER — Other Ambulatory Visit

## 2023-08-26 DIAGNOSIS — E1169 Type 2 diabetes mellitus with other specified complication: Secondary | ICD-10-CM

## 2023-08-26 DIAGNOSIS — E782 Mixed hyperlipidemia: Secondary | ICD-10-CM

## 2023-08-26 LAB — COMPREHENSIVE METABOLIC PANEL WITH GFR
ALT: 11 U/L (ref 0–35)
AST: 14 U/L (ref 0–37)
Albumin: 4.3 g/dL (ref 3.5–5.2)
Alkaline Phosphatase: 105 U/L (ref 39–117)
BUN: 17 mg/dL (ref 6–23)
CO2: 28 meq/L (ref 19–32)
Calcium: 9.3 mg/dL (ref 8.4–10.5)
Chloride: 101 meq/L (ref 96–112)
Creatinine, Ser: 0.91 mg/dL (ref 0.40–1.20)
GFR: 66.28 mL/min (ref >60.00)
Glucose, Bld: 281 mg/dL — ABNORMAL HIGH (ref 70–99)
Potassium: 4.1 meq/L (ref 3.5–5.1)
Sodium: 136 meq/L (ref 135–145)
Total Bilirubin: 0.7 mg/dL (ref 0.2–1.2)
Total Protein: 7 g/dL (ref 6.0–8.3)

## 2023-08-26 LAB — HEMOGLOBIN A1C
Hgb A1c MFr Bld: 11.5 % — ABNORMAL HIGH (ref <5.7)
Mean Plasma Glucose: 283 mg/dL
eAG (mmol/L): 15.7 mmol/L

## 2023-08-26 LAB — LIPID PANEL
Cholesterol: 95 mg/dL (ref 0–200)
HDL: 31.1 mg/dL — ABNORMAL LOW (ref >39.00)
LDL Cholesterol: 33 mg/dL (ref 0–99)
NonHDL: 64.23
Total CHOL/HDL Ratio: 3
Triglycerides: 157 mg/dL — ABNORMAL HIGH (ref 0.0–149.0)
VLDL: 31.4 mg/dL (ref 0.0–40.0)

## 2023-08-26 LAB — LDL CHOLESTEROL, DIRECT: Direct LDL: 47 mg/dL

## 2023-08-26 NOTE — Addendum Note (Signed)
 Addended by: TANDA HARVEY D on: 08/26/2023 08:38 AM   Modules accepted: Orders

## 2023-09-02 ENCOUNTER — Telehealth: Payer: Self-pay

## 2023-09-02 ENCOUNTER — Ambulatory Visit: Attending: Cardiology | Admitting: Cardiology

## 2023-09-02 ENCOUNTER — Encounter: Payer: Self-pay | Admitting: Cardiology

## 2023-09-02 VITALS — BP 160/90 | HR 92 | Ht 66.0 in | Wt 157.6 lb

## 2023-09-02 DIAGNOSIS — I10 Essential (primary) hypertension: Secondary | ICD-10-CM

## 2023-09-02 DIAGNOSIS — E1169 Type 2 diabetes mellitus with other specified complication: Secondary | ICD-10-CM | POA: Diagnosis not present

## 2023-09-02 DIAGNOSIS — E782 Mixed hyperlipidemia: Secondary | ICD-10-CM | POA: Diagnosis not present

## 2023-09-02 DIAGNOSIS — Z720 Tobacco use: Secondary | ICD-10-CM | POA: Diagnosis not present

## 2023-09-02 MED ORDER — NICOTINE 7 MG/24HR TD PT24: 7.0000 mg | MEDICATED_PATCH | Freq: Every day | TRANSDERMAL | 0 refills | Status: AC

## 2023-09-02 MED ORDER — NICOTINE 14 MG/24HR TD PT24: 14.0000 mg | MEDICATED_PATCH | Freq: Every day | TRANSDERMAL | 0 refills | Status: AC

## 2023-09-02 MED ORDER — BLOOD PRESSURE CUFF MISC: 0 refills | Status: AC

## 2023-09-02 MED ORDER — NICOTINE 21 MG/24HR TD PT24: 21.0000 mg | MEDICATED_PATCH | Freq: Every day | TRANSDERMAL | 0 refills | Status: AC

## 2023-09-02 NOTE — Telephone Encounter (Signed)
 Hello Charan - When does pt need to follow up with you?  Her recent A1C was 11.5 and I don't see where we've called her with those results or recommendations.  Her last appointment with you was 05/15/23 and has a future appointment for 01/01/24.  She is part of the [employer] for controlling A1C.  Do you want me to schedule her to see you soon?

## 2023-09-02 NOTE — Progress Notes (Signed)
 Cardiology Office Note   Date:  09/02/2023  ID:  GRACIELA PLATO, DOB [birth year], MRN [id number] PCP: Vincente Saber, NP  [facility] Providers Cardiologist:  Redell Cave, MD Cardiology APP:  Gerard Frederick, NP     History of Present Illness Barbara Larsen is a 63 y.o. female with a past medical history of hypertension, hyperlipidemia, current smoker x 40+ years, type 2 diabetes, who is here today for follow-up.  Patient was last seen in clinic June 05, 2023 by Dr.Agbor-Etang.  At that time she was seen as a new patient and was referred for heart evaluation.  She had an appointment with her PCP approximately 3 months prior and her systolic blood pressure was noted to be 160s-180s.  She states that dietary indiscretions was the cause of her elevated blood pressures.  She was currently taking lisinopril  10 mg daily for blood pressure control.  Blood pressures had been relatively well-controlled.  She continues to smoke and is planning on quitting.  Was prescribed nicotine  patches but was not able to pick it up at the pharmacy due to unavailability.  Denied any chest pain, shortness of breath dizziness peripheral edema palpitation.  She had no concerns stating that she felt well at the time of her visit.  EKG revealed sinus rhythm with possible old septal infarct with a rate of 95.  No abnormal assessment findings were found.  She was continued on lisinopril  10 mg daily, smoking cessation was emphasized, started on aspirin 81 mg daily and given a new prescription of nicotine  patches.  She returns to clinic today stating that she has been doing well with a cardiac perspective.  She denies any chest pain, shortness of breath, palpitations, peripheral edema.  States that she was running into her appointment this morning and she has not had her medications as of yet today.  States that she is compliant with her medication without any side effects.  Denies any hospitalizations or visits to  the emergency department.  ROS: 10 point review of system has been reviewed and considered negative except ones were listed in the HPI  Studies Reviewed     Risk Assessment/Calculations   HYPERTENSION CONTROL Vitals:   09/02/23 1035 09/02/23 1042  BP: (!) 160/84 (!) 160/90    The patient's blood pressure is elevated above target today.  In order to address the patient's elevated BP: Blood pressure will be monitored at home to determine if medication changes need to be made.; The blood pressure is usually elevated in clinic.  Blood pressures monitored at home have been optimal. (No medications as of yet today)          Physical Exam VS:  BP (!) 160/90 (BP Location: Right Arm, Patient Position: Sitting, Cuff Size: Normal)   Pulse 92   Ht 5' 6 (1.676 m)   Wt 157 lb 9.6 oz (71.5 kg)   SpO2 98%   BMI 25.44 kg/m        Wt Readings from Last 3 Encounters:  09/02/23 157 lb 9.6 oz (71.5 kg)  05/15/23 156 lb 3.2 oz (70.9 kg)  03/06/23 156 lb 12.8 oz (71.1 kg)    GEN: Well nourished, well developed in no acute distress NECK: No JVD; No carotid bruits CARDIAC: RRR, no murmurs, rubs, gallops RESPIRATORY:  Clear to auscultation without rales, wheezing or rhonchi  ABDOMEN: Soft, non-tender, non-distended EXTREMITIES:  No edema; No deformity   ASSESSMENT AND PLAN Primary hypertension with blood pressure today 160/84.  Further review  patient's blood pressure been well-controlled at several doctors visits that she has had lately.  She states she has yet to have her medications sent this morning.  She has been continued on lisinopril  10 mg daily.  She has also been given a prescription for a blood pressure cuff to monitor her blood pressure 1 to 2 hours postmedication administration at home as well.  Mixed hyperlipidemia with last LDL was 33.  She has been continued on atorvastatin  40 mg daily.  Tobacco abuse which she continues to smoke half a pack per day for 40+ years.  Previously was  prescribed nicotine  patches overnight.  By the pharmacy new prescription sent today with total cessation recommended.  Type 2 diabetes with last hemoglobin A1c of 11.5.  She is continued on metformin  1000 mg twice daily with ongoing management per PCP.       Dispo: Patient to return to clinic to see MD/APP in 10 to 12 weeks or sooner if needed for further evaluation.  Patient has also been encouraged to try and upload blood pressures to my chart for review once she has her blood pressure cuff and is taking them at home.  Signed, Audrionna Lampton, NP

## 2023-09-02 NOTE — Patient Instructions (Signed)
 Medication Instructions:  Your physician recommends the following medication changes.  START TAKING: Nicotine  patches 21 - 14 - 7 mg       RX for BP cuff  *If you need a refill on your cardiac medications before your next appointment, please call your pharmacy*  Lab Work: No labs ordered today  If you have labs (blood work) drawn today and your tests are completely normal, you will receive your results only by: MyChart Message (if you have MyChart) OR A paper copy in the mail If you have any lab test that is abnormal or we need to change your treatment, we will call you to review the results.  Testing/Procedures: No test ordered today   Follow-Up: At [facility], you and your health needs are our priority.  As part of our continuing mission to provide you with exceptional heart care, our providers are all part of one team.  This team includes your primary Cardiologist (physician) and Advanced Practice Providers or APPs (Physician Assistants and Nurse Practitioners) who all work together to provide you with the care you need, when you need it.  Your next appointment:   3 month(s)  Provider:   Redell Cave, MD or Tylene Lunch, NP

## 2023-09-03 ENCOUNTER — Ambulatory Visit: Payer: Self-pay | Admitting: Nurse Practitioner

## 2023-09-04 ENCOUNTER — Ambulatory Visit: Admitting: Cardiology

## 2023-09-04 NOTE — Telephone Encounter (Signed)
 Hello Barbara Larsen, She is schedule now for OV on 08/01. Thank you so much.

## 2023-09-18 ENCOUNTER — Ambulatory Visit: Admitting: Nurse Practitioner

## 2023-09-22 ENCOUNTER — Encounter: Payer: Self-pay | Admitting: Nurse Practitioner

## 2023-10-16 ENCOUNTER — Ambulatory Visit: Admitting: Nurse Practitioner

## 2023-11-03 ENCOUNTER — Encounter: Payer: Self-pay | Admitting: Nurse Practitioner

## 2023-11-03 ENCOUNTER — Ambulatory Visit (INDEPENDENT_AMBULATORY_CARE_PROVIDER_SITE_OTHER): Admitting: Nurse Practitioner

## 2023-11-03 VITALS — BP 124/74 | HR 93 | Temp 98.7°F | Ht 66.0 in | Wt 153.8 lb

## 2023-11-03 DIAGNOSIS — I10 Essential (primary) hypertension: Secondary | ICD-10-CM | POA: Diagnosis not present

## 2023-11-03 DIAGNOSIS — E1169 Type 2 diabetes mellitus with other specified complication: Secondary | ICD-10-CM

## 2023-11-03 DIAGNOSIS — E782 Mixed hyperlipidemia: Secondary | ICD-10-CM | POA: Diagnosis not present

## 2023-11-03 LAB — GLUCOSE, POCT (MANUAL RESULT ENTRY): POC Glucose: 227 mg/dL — AB (ref 70–99)
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
## 2023-11-03 NOTE — Progress Notes (Signed)
 Medication Samples have been provided to the patient.  Drug name: Rybelsus        Strength: 3 mg        Qty: 30 tablets  LOT: [id number]  Exp.Date: 02/17/2024  Dosing instructions: Take 1 tablet by mouth in the morning on an empty stomach with a glass of water.  The patient has been instructed regarding the correct time, dose, and frequency of taking this medication, including desired effects and most common side effects.   Barbara Larsen 2:15 PM 11/03/2023

## 2023-11-17 MED ORDER — RYBELSUS 7 MG PO TABS: 7.0000 mg | ORAL_TABLET | Freq: Every day | ORAL | 1 refills | Status: DC

## 2023-11-17 NOTE — Assessment & Plan Note (Signed)
 Patient BP  Vitals:   11/03/23 1323  BP: 124/74  -Advised pt to follow a low sodium and heart healthy diet. -Continue lisinopril  from 10 mg daily.

## 2023-11-17 NOTE — Assessment & Plan Note (Signed)
 Managed with atorvastatin . - Continue atorvastatin  40 mg daily.

## 2023-11-17 NOTE — Assessment & Plan Note (Signed)
 Type 2 diabetes with A1c of 11.5% and blood glucose of 227 mg/dL, indicating poor control. On maximum metformin , not started Trulicity  due to pharmacy issues. Hesitant about injections, concerned about Jardiance, open to oral medications. - Rybelsus  sample provided. - Educated on blood glucose monitoring and control.

## 2023-11-27 ENCOUNTER — Telehealth: Payer: Self-pay

## 2023-11-27 NOTE — Telephone Encounter (Signed)
 Patient was identified as falling into the [place] Measure - Diabetes.   Patient was: Appointment already scheduled for:  01/01/24.

## 2023-12-03 ENCOUNTER — Other Ambulatory Visit: Payer: Self-pay | Admitting: Nurse Practitioner

## 2023-12-03 DIAGNOSIS — E1169 Type 2 diabetes mellitus with other specified complication: Secondary | ICD-10-CM

## 2023-12-07 ENCOUNTER — Ambulatory Visit: Attending: Cardiology | Admitting: Cardiology

## 2023-12-07 ENCOUNTER — Encounter: Payer: Self-pay | Admitting: Cardiology

## 2023-12-07 ENCOUNTER — Ambulatory Visit: Admitting: Cardiology

## 2023-12-07 VITALS — BP 180/90 | HR 113 | Ht 66.0 in | Wt 150.0 lb

## 2023-12-07 DIAGNOSIS — E782 Mixed hyperlipidemia: Secondary | ICD-10-CM | POA: Diagnosis not present

## 2023-12-07 DIAGNOSIS — I10 Essential (primary) hypertension: Secondary | ICD-10-CM

## 2023-12-07 DIAGNOSIS — E1169 Type 2 diabetes mellitus with other specified complication: Secondary | ICD-10-CM

## 2023-12-07 DIAGNOSIS — Z720 Tobacco use: Secondary | ICD-10-CM
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
## 2023-12-07 NOTE — Patient Instructions (Signed)
 Medication Instructions:   Your physician recommends that you continue on your current medications as directed. Please refer to the Current Medication list given to you today.   *If you need a refill on your cardiac medications before your next appointment, please call your pharmacy*  Lab Work:  No labs ordered today   If you have labs (blood work) drawn today and your tests are completely normal, you will receive your results only by: MyChart Message (if you have MyChart) OR A paper copy in the mail If you have any lab test that is abnormal or we need to change your treatment, we will call you to review the results.  Testing/Procedures:  No test ordered today   Follow-Up: At [facility], you and your health needs are our priority.  As part of our continuing mission to provide you with exceptional heart care, our providers are all part of one team.  This team includes your primary Cardiologist (physician) and Advanced Practice Providers or APPs (Physician Assistants and Nurse Practitioners) who all work together to provide you with the care you need, when you need it.  Your next appointment:   3 month(s)  Provider:   You may see Constancia Delton, MD or one of the following Advanced Practice Providers on your designated Care Team:   Laneta Pintos, NP Gildardo Labrador, PA-C Varney Gentleman, PA-C Cadence Francis, PA-C Ronald Cockayne, NP Morey Ar, NP    We recommend signing up for the patient portal called MyChart.  Sign up information is provided on this After Visit Summary.  MyChart is used to connect with patients for Virtual Visits (Telemedicine).  Patients are able to view lab/test results, encounter notes, upcoming appointments, etc.  Non-urgent messages can be sent to your provider as well.   To learn more about what you can do with MyChart, go to [email or web address].

## 2024-01-01 ENCOUNTER — Encounter: Admitting: Nurse Practitioner

## 2024-01-08 ENCOUNTER — Other Ambulatory Visit: Payer: Self-pay

## 2024-01-08 ENCOUNTER — Telehealth: Payer: Self-pay | Admitting: Nurse Practitioner

## 2024-01-08 ENCOUNTER — Other Ambulatory Visit

## 2024-01-08 ENCOUNTER — Other Ambulatory Visit: Payer: Self-pay | Admitting: Nurse Practitioner

## 2024-01-08 DIAGNOSIS — E1169 Type 2 diabetes mellitus with other specified complication: Secondary | ICD-10-CM | POA: Diagnosis not present

## 2024-01-08 NOTE — Telephone Encounter (Signed)
Patient is scheduled for today at 3:45.

## 2024-01-08 NOTE — Telephone Encounter (Signed)
 Noted! Thank you

## 2024-01-09 LAB — COMPREHENSIVE METABOLIC PANEL WITH GFR
ALT: 11 IU/L (ref 0–32)
AST: 13 IU/L (ref 0–40)
Albumin: 4.2 g/dL (ref 3.9–4.9)
Alkaline Phosphatase: 117 IU/L (ref 49–135)
BUN/Creatinine Ratio: 14 (ref 12–28)
BUN: 14 mg/dL (ref 8–27)
Bilirubin Total: 0.5 mg/dL (ref 0.0–1.2)
CO2: 25 mmol/L (ref 20–29)
Calcium: 9.6 mg/dL (ref 8.7–10.3)
Chloride: 99 mmol/L (ref 96–106)
Creatinine, Ser: 0.98 mg/dL (ref 0.57–1.00)
Globulin, Total: 2.1 g/dL (ref 1.5–4.5)
Glucose: 334 mg/dL — ABNORMAL HIGH (ref 70–99)
Potassium: 3.9 mmol/L (ref 3.5–5.2)
Sodium: 138 mmol/L (ref 134–144)
Total Protein: 6.3 g/dL (ref 6.0–8.5)
eGFR: 64 mL/min/1.73 (ref >59)

## 2024-01-09 LAB — HEMOGLOBIN A1C
Est. average glucose Bld gHb Est-mCnc: 338 mg/dL
Hgb A1c MFr Bld: 13.4 % — ABNORMAL HIGH (ref 4.8–5.6)

## 2024-01-10 ENCOUNTER — Ambulatory Visit: Payer: Self-pay | Admitting: Nurse Practitioner

## 2024-01-10 NOTE — Progress Notes (Signed)
 Her A1c increased from 11.5 to 13.4.  Please check with patient what medication she is taking and schedule office visit to discuss medication management.  Liver, kidney, electrolytes normal

## 2024-01-11 NOTE — Progress Notes (Signed)
 Noted! Thank you

## 2024-01-13 ENCOUNTER — Telehealth: Payer: Self-pay | Admitting: Nurse Practitioner

## 2024-01-13 NOTE — Telephone Encounter (Signed)
 Lm and sent MyChart message:  Barbara Larsen will be out of the office on December 19th.  Please call the office or reschedule your appointment through MyChart.  E2C2 Please reschedule appt

## 2024-01-22 ENCOUNTER — Encounter: Payer: Self-pay | Admitting: Pharmacist

## 2024-01-22 NOTE — Progress Notes (Signed)
 Pharmacy Quality Measure Review  This patient is appearing on a report for being at risk of failing the adherence measure for cholesterol (statin) medications this calendar year.   Medication: atorvastatin  40 mg Last fill date: 7/18 for 90 day supply  Insurance report was not up to date. No action needed at this time.  Filled Nov, though never picked up. Confirmed medication has been refilled as of 01/18/24, picked up 12/2 x90ds.

## 2024-02-05 ENCOUNTER — Encounter: Admitting: Nurse Practitioner

## 2024-03-09 ENCOUNTER — Ambulatory Visit: Admitting: Cardiology

## 2024-03-10 ENCOUNTER — Ambulatory Visit: Admitting: Cardiology

## 2024-03-11 ENCOUNTER — Telehealth: Payer: Self-pay | Admitting: Nurse Practitioner

## 2024-03-11 ENCOUNTER — Telehealth: Payer: Self-pay

## 2024-03-11 ENCOUNTER — Ambulatory Visit: Admitting: Nurse Practitioner

## 2024-03-11 ENCOUNTER — Ambulatory Visit: Attending: Cardiology | Admitting: Cardiology

## 2024-03-11 ENCOUNTER — Encounter: Payer: Self-pay | Admitting: Cardiology

## 2024-03-11 VITALS — BP 146/98 | HR 96 | Ht 66.0 in | Wt 149.0 lb

## 2024-03-11 DIAGNOSIS — E782 Mixed hyperlipidemia: Secondary | ICD-10-CM | POA: Diagnosis not present

## 2024-03-11 DIAGNOSIS — I10 Essential (primary) hypertension: Secondary | ICD-10-CM | POA: Diagnosis not present

## 2024-03-11 DIAGNOSIS — Z720 Tobacco use: Secondary | ICD-10-CM

## 2024-03-11 DIAGNOSIS — E1169 Type 2 diabetes mellitus with other specified complication: Secondary | ICD-10-CM

## 2024-03-11 MED ORDER — LISINOPRIL 20 MG PO TABS: 20.0000 mg | ORAL_TABLET | Freq: Every day | ORAL | 3 refills | Status: AC

## 2024-03-11 NOTE — Telephone Encounter (Signed)
 Patient is scheduled for 03/18/24 at 9:40. Patient states she is Not taking Rybelsus  nor does she have any Rybelsus .

## 2024-03-11 NOTE — Telephone Encounter (Signed)
 Please check if she is not taking rybelsus ? We can reschedule her apt sooner then feb 20.

## 2024-03-11 NOTE — Telephone Encounter (Signed)
 Error
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
## 2024-03-11 NOTE — Telephone Encounter (Signed)
 Noted thank you

## 2024-03-11 NOTE — Patient Instructions (Addendum)
 Medication Instructions:   Your physician recommends the following medication changes.  INCREASE:  Lisinopril  10 mg to 20 mg   Continue all medications as prescribed.  *If you need a refill on your cardiac medications before your next appointment, please call your pharmacy*  Lab Work:  Your provider would like for you to return in 2 - 3 weeks to have the following labs drawn: BMP.   Please go to [facility] [address] ([facility] [address], [place] [address] You do not need an appointment.  They are open from 8 am- 4:30 pm.  Lunch from 1:00 pm- 2:00 pm You DO NOT need to be fasting.  If you have labs (blood work) drawn today and your tests are completely normal, you will receive your results only by: MyChart Message (if you have MyChart) OR A paper copy in the mail If you have any lab test that is abnormal or we need to change your treatment, we will call you to review the results.  Testing/Procedures: No test ordered today   Follow-Up: At [facility], you and your health needs are our priority.  As part of our continuing mission to provide you with exceptional heart care, our providers are all part of one team.  This team includes your primary Cardiologist (physician) and Advanced Practice Providers or APPs (Physician Assistants and Nurse Practitioners) who all work together to provide you with the care you need, when you need it.  Your next appointment:  3 - 4 week(s)  Provider:  Tylene Lunch, NP    We recommend signing up for the patient portal called MyChart.  Sign up information is provided on this After Visit Summary.  MyChart is used to connect with patients for Virtual Visits (Telemedicine).  Patients are able to view lab/test results, encounter notes, upcoming appointments, etc.  Non-urgent messages can be sent to your provider as well.   To learn more about what you can do with MyChart, go to [email or web address].

## 2024-03-11 NOTE — Telephone Encounter (Signed)
 error

## 2024-03-11 NOTE — Telephone Encounter (Signed)
 Called Patient and she states she can Not check her sugar due to her machine has been broke for the past 2-3 weeks. Patient confirmed she is taking Metformin  1000 mg twice a day. Patient states she will see us  on 04/08/24.

## 2024-03-18 ENCOUNTER — Telehealth: Payer: Self-pay

## 2024-03-18 ENCOUNTER — Encounter: Payer: Self-pay | Admitting: Nurse Practitioner

## 2024-03-18 ENCOUNTER — Other Ambulatory Visit: Payer: Self-pay

## 2024-03-18 ENCOUNTER — Ambulatory Visit (INDEPENDENT_AMBULATORY_CARE_PROVIDER_SITE_OTHER): Admitting: Nurse Practitioner

## 2024-03-18 ENCOUNTER — Other Ambulatory Visit: Payer: Self-pay | Admitting: Nurse Practitioner

## 2024-03-18 VITALS — BP 136/84 | HR 72 | Temp 98.1°F | Ht 66.0 in | Wt 150.2 lb

## 2024-03-18 DIAGNOSIS — I10 Essential (primary) hypertension: Secondary | ICD-10-CM | POA: Diagnosis not present

## 2024-03-18 DIAGNOSIS — Z794 Long term (current) use of insulin: Secondary | ICD-10-CM | POA: Diagnosis not present

## 2024-03-18 DIAGNOSIS — E1165 Type 2 diabetes mellitus with hyperglycemia: Secondary | ICD-10-CM | POA: Diagnosis not present

## 2024-03-18 DIAGNOSIS — E1169 Type 2 diabetes mellitus with other specified complication: Secondary | ICD-10-CM

## 2024-03-18 MED ORDER — DEXCOM G7 SENSOR MISC: 6 refills | Status: AC

## 2024-03-18 MED ORDER — DEXCOM G7 RECEIVER DEVI: 3 refills | Status: AC

## 2024-03-18 MED ORDER — METFORMIN HCL 1000 MG PO TABS: 1000.0000 mg | ORAL_TABLET | Freq: Two times a day (BID) | ORAL | 3 refills | Status: AC

## 2024-03-18 MED ORDER — LANTUS SOLOSTAR 100 UNIT/ML ~~LOC~~ SOPN: 10.0000 [IU] | PEN_INJECTOR | Freq: Every day | SUBCUTANEOUS | 99 refills | Status: DC

## 2024-03-18 NOTE — Telephone Encounter (Signed)
 Pended Dexcom G7 receiver for [employer] review per patient daughter request due to phone is not compatible.

## 2024-03-18 NOTE — Assessment & Plan Note (Signed)
 Recent  A1c of 13.4%. Current metformin  therapy insufficient. She is not taking any other medication other then metformin  for diabetes control. On Ace and statin therapy. Discussed insulin  therapy and CGM for improved glucose management. - Will start on lantus  10 units daily and increase by 2 units every 3 days. - Continue metformin  1000 mg twice a day. - CGM sample provided and prescription sent to the pharmacy.  - Scheduled video visit in two weeks and  in-person visit in one month. - Instructed to contact office if unable to obtain medication. - Encouraged to schedule eye examination. Orders:   insulin  glargine (LANTUS  SOLOSTAR) 100 UNIT/ML Solostar Pen; Inject 10 Units into the skin daily. Increase 2 units every 3 days until BS less than 130.   Continuous Glucose Sensor (DEXCOM G7 SENSOR) MISC; Use as directed   metFORMIN  (GLUCOPHAGE ) 1000 MG tablet; Take 1 tablet (1,000 mg total) by mouth 2 (two) times daily.

## 2024-03-18 NOTE — Progress Notes (Signed)
 "  Established Patient Office Visit  Subjective:  Patient ID: Barbara Larsen, female    DOB: [birth year]  Age: 64 y.o. MRN: [id number]  CC:  Chief Complaint  Patient presents with   Medical Management of Chronic Issues   Discussed the use of AI scribe software for clinical note transcription with the patient, who gave verbal consent to proceed.  History of Present Illness   History of Present Illness   CERISSA Larsen is a 64 year old female with diabetes who presents for management of elevated blood glucose levels.  She checks glucose with her daughter's meter. Last week her fasting glucose was 200 mg/dL. She typically only has coffee in the morning and does not eat until around noon.  She takes metformin . She previously tried Trulicity , Ozempic , and Rybelsus  but could not continue Rybelsus  due to not ab;le to receive the medication from the pharmacy.  She has not contacted the office in the mean while for medication unavailability.  Lab Results  Component Value Date   HGBA1C 13.4 (H) 01/08/2024   She denise increased urination, thirst, or vision changes. She has not had an eye exam in over a year and her daughter is helping her find a new eye doctor.  She wakes early to take her grandchildren to school and attends her own classes three days a week. She usually eats after school and often snacks late at night on dinner leftovers before bed.     Past Medical History:  Diagnosis Date   Diabetes mellitus without complication (HCC)    Hypertension     Past Surgical History:  Procedure Laterality Date   COLONOSCOPY WITH PROPOFOL  N/A 11/14/2020   Procedure: COLONOSCOPY WITH PROPOFOL ;  Surgeon: Unk Corinn Skiff, MD;  Location: [facility] ENDOSCOPY;  Service: Gastroenterology;  Laterality: N/A;    Family History  Problem Relation Age of Onset   Breast cancer Neg Hx     Social History   Socioeconomic History   Marital status: Divorced    Spouse name: Not on file   Number of  children: Not on file   Years of education: Not on file   Highest education level: Not on file  Occupational History   Not on file  Tobacco Use   Smoking status: Every Day    Current packs/day: 1.00    Average packs/day: 1 pack/day for 41.1 years (41.1 ttl pk-yrs)    Types: Cigarettes    Start date: 69   Smokeless tobacco: Never   Tobacco comments:    1 pack a day as of 05/15/23  Substance and Sexual Activity   Alcohol use: Not Currently    Comment: occasionaly   Drug use: Never   Sexual activity: Not Currently  Other Topics Concern   Not on file  Social History Narrative   Not on file   Social Drivers of Health   Tobacco Use: High Risk (03/18/2024)   Patient History    Smoking Tobacco Use: Every Day    Smokeless Tobacco Use: Never    Passive Exposure: Not on file  Financial Resource Strain: Not on file  Food Insecurity: Not on file  Transportation Needs: Not on file  Physical Activity: Not on file  Stress: Not on file  Social Connections: Not on file  Intimate Partner Violence: Not on file  Depression (EYV7-0): Low Risk (03/18/2024)   Depression (PHQ2-9)    PHQ-2 Score: 0  Alcohol Screen: Not on file  Housing: Not on file  Utilities: Not on  file  Health Literacy: Not on file     Outpatient Medications Prior to Visit  Medication Sig Dispense Refill   atorvastatin  (LIPITOR) 40 MG tablet Take 1 tablet (40 mg total) by mouth daily. 90 tablet 2   Blood Glucose Monitoring Suppl DEVI 1 each by Does not apply route in the morning, at noon, and at bedtime. May substitute to any manufacturer covered by patient's insurance. 1 each 0   Blood Pressure Monitoring (BLOOD PRESSURE CUFF) MISC Check blood pressure as instructed by your physician 1 each 0   Continuous Glucose Sensor (FREESTYLE LIBRE 2 PLUS SENSOR) MISC by Does not apply route.     Continuous Glucose Sensor (FREESTYLE LIBRE 3 SENSOR) MISC Place 1 sensor on the skin every 14 days. Use to check glucose continuously 2  each 3   lisinopril  (ZESTRIL ) 20 MG tablet Take 1 tablet (20 mg total) by mouth daily. 90 tablet 3   metFORMIN  (GLUCOPHAGE ) 1000 MG tablet Take 1 tablet by mouth twice daily 180 tablet 0   Semaglutide  (RYBELSUS ) 7 MG TABS Take 1 tablet (7 mg total) by mouth daily. 30 tablet 1   No facility-administered medications prior to visit.    Allergies[1]  ROS Review of Systems Negative unless indicated in HPI.    Objective:    Physical Exam  BP 136/84   Pulse 72   Temp 98.1 F (36.7 C) (Oral)   Ht 5' 6 (1.676 m)   Wt 150 lb 3.2 oz (68.1 kg)   SpO2 97%   BMI 24.24 kg/m  Wt Readings from Last 3 Encounters:  03/18/24 150 lb 3.2 oz (68.1 kg)  03/11/24 149 lb (67.6 kg)  12/07/23 150 lb (68 kg)     Health Maintenance  Topic Date Due   Medicare Annual Wellness (AWV)  Never done   FOOT EXAM  Never done   HIV Screening  Never done   Diabetic kidney evaluation - Urine ACR  Never done   Hepatitis C Screening  Never done   DTaP/Tdap/Td (1 - Tdap) Never done   Pneumococcal Vaccine: 50+ Years (1 of 2 - PCV) Never done   Cervical Cancer Screening (HPV/Pap Cotest)  Never done   Lung Cancer Screening  Never done   Zoster Vaccines- Shingrix (1 of 2) Never done   OPHTHALMOLOGY EXAM  05/18/2022   Bone Density Scan  Never done   COVID-19 Vaccine (1 - 2025-26 season) Never done   Influenza Vaccine  05/17/2024 (Originally 09/18/2023)   HEMOGLOBIN A1C  07/07/2024   Diabetic kidney evaluation - eGFR measurement  01/07/2025   Mammogram  06/18/2025   Colonoscopy  11/14/2025   Hepatitis B Vaccines 19-59 Average Risk  Aged Out   Meningococcal B Vaccine  Aged Out    There are no preventive care reminders to display for this patient.  Lab Results  Component Value Date   TSH 0.99 05/15/2023   Lab Results  Component Value Date   WBC 5.5 05/15/2023   HGB 13.2 05/15/2023   HCT 39.8 05/15/2023   MCV 82.2 05/15/2023   PLT 272 05/15/2023   Lab Results  Component Value Date   NA 138  01/08/2024   K 3.9 01/08/2024   CO2 25 01/08/2024   GLUCOSE 334 (H) 01/08/2024   BUN 14 01/08/2024   CREATININE 0.98 01/08/2024   BILITOT 0.5 01/08/2024   ALKPHOS 117 01/08/2024   AST 13 01/08/2024   ALT 11 01/08/2024   PROT 6.3 01/08/2024   ALBUMIN 4.2 01/08/2024  CALCIUM  9.6 01/08/2024   ANIONGAP 11 02/03/2023   EGFR 64 01/08/2024   GFR 66.28 08/26/2023   Lab Results  Component Value Date   CHOL 95 08/26/2023   Lab Results  Component Value Date   HDL 31.10 (L) 08/26/2023   Lab Results  Component Value Date   LDLCALC 33 08/26/2023   Lab Results  Component Value Date   TRIG 157.0 (H) 08/26/2023   Lab Results  Component Value Date   CHOLHDL 3 08/26/2023   Lab Results  Component Value Date   HGBA1C 13.4 (H) 01/08/2024      Assessment & Plan:   Assessment & Plan Type 2 diabetes mellitus with hyperglycemia, with long-term current use of insulin  (HCC) Recent  A1c of 13.4%. Current metformin  therapy insufficient. She is not taking any other medication other then metformin  for diabetes control. On Ace and statin therapy. Discussed insulin  therapy and CGM for improved glucose management. - Will start on lantus  10 units daily and increase by 2 units every 3 days. - Continue metformin  1000 mg twice a day. - CGM sample provided and prescription sent to the pharmacy.  - Scheduled video visit in two weeks and  in-person visit in one month. - Instructed to contact office if unable to obtain medication. - Encouraged to schedule eye examination. Orders:   insulin  glargine (LANTUS  SOLOSTAR) 100 UNIT/ML Solostar Pen; Inject 10 Units into the skin daily. Increase 2 units every 3 days until BS less than 130.   Continuous Glucose Sensor (DEXCOM G7 SENSOR) MISC; Use as directed   metFORMIN  (GLUCOPHAGE ) 1000 MG tablet; Take 1 tablet (1,000 mg total) by mouth 2 (two) times daily.  Primary hypertension Patient BP  Vitals:   03/18/24 0952  BP: 136/84  -Advised pt to follow a  low sodium and heart healthy diet. -Continue lisinopril  20 mg daily.      Assessment and Plan Assessment & Plan    Follow-up: Return in about 1 month (around 04/16/2024).   Keyla Milone, NP     [1]  Allergies Allergen Reactions   Codeine Other (See Comments)    Too strong. Patient felt off   "

## 2024-03-18 NOTE — Assessment & Plan Note (Addendum)
 Patient BP  Vitals:   03/18/24 0952  BP: 136/84  -Advised pt to follow a low sodium and heart healthy diet. -Continue lisinopril  20 mg daily.

## 2024-03-18 NOTE — Patient Instructions (Signed)
 Today we discussed the management of your elevated blood glucose levels. We have made some changes to your treatment plan to help improve your glucose control.  YOUR PLAN:  TYPE 2 DIABETES MELLITUS WITH HYPERGLYCEMIA: Your blood glucose levels are very high, and your current medication is not enough to control it. -Start taking insulin  at 10 units daily. Increase the dose by 2 units every 3 days. -Continue taking metformin  as prescribed. -We have started you on a Dexcom Continuous Glucose Monitor (CGM) to help track your glucose levels. -We have scheduled a video visit in two weeks and an in-person visit in one month to monitor your progress. -Please contact our office if you are unable to obtain your medication. -Make sure to schedule an eye examination soon.

## 2024-03-25 ENCOUNTER — Telehealth: Payer: Self-pay

## 2024-03-25 ENCOUNTER — Other Ambulatory Visit (HOSPITAL_COMMUNITY): Payer: Self-pay

## 2024-03-25 NOTE — Telephone Encounter (Signed)
 Called Patient and daughter to let them know that the Dexcom G7  sensor and receiver need prior authorization per [facility] and that I sent that message to the prior authorization team.

## 2024-03-25 NOTE — Telephone Encounter (Signed)
 Copied from CRM #[id number]. Topic: Clinical - Medication Question >> Mar 18, 2024 11:29 AM Avram MATSU wrote: Reason for CRM: daughter is calling stating her mothers dexcom is not compatible with her phone and would need a receiver sent to [facility] [address] [place] - [address] [place] [address] Phone: [phone] Fax: [phone] >> Mar 25, 2024 11:18 AM China J wrote: The patient's daughter is calling in because [facility] has not received the DEXCOM G7 SENSOR & DEXCOM G7 RECEIVER. She was wondering if the patient's provider could resend the scripts.

## 2024-03-25 NOTE — Telephone Encounter (Signed)
 Called [facility] to confirm that they received the prescriptions and the Pharmacist states they need a prior authorization on the Dexcom G7 sensor and the Dexcom G7 receiver.

## 2024-04-06 ENCOUNTER — Ambulatory Visit: Admitting: Cardiology

## 2024-04-08 ENCOUNTER — Ambulatory Visit: Admitting: Nurse Practitioner

## 2024-04-22 ENCOUNTER — Ambulatory Visit: Admitting: Nurse Practitioner
